# Patient Record
Sex: Male | Born: 1965 | Race: White | Hispanic: No | Marital: Married | Smoking: Never smoker
Health system: Southern US, Community
[De-identification: ages and names within clinical notes are randomized; demographics above are authoritative.]

## PROBLEM LIST (undated history)

## (undated) DIAGNOSIS — I10 Essential (primary) hypertension: Secondary | ICD-10-CM

---

## 2008-04-09 ENCOUNTER — Encounter: Payer: Self-pay | Admitting: Family Medicine

## 2008-04-10 ENCOUNTER — Encounter: Payer: Self-pay | Admitting: Family Medicine

## 2008-04-10 LAB — CONVERTED CEMR LAB
AST: 27 units/L
Albumin: 4.7 g/dL
Calcium: 10.1 mg/dL
Chloride: 98 meq/L
Cholesterol: 217 mg/dL
Creatinine, Ser: 1.5 mg/dL
HDL: 38 mg/dL
Potassium: 4.4 meq/L
Sodium: 136 meq/L
Total Protein: 7.6 g/dL
Triglycerides: 129 mg/dL

## 2008-11-11 ENCOUNTER — Ambulatory Visit: Payer: Self-pay | Admitting: Family Medicine

## 2008-11-11 DIAGNOSIS — M25569 Pain in unspecified knee: Secondary | ICD-10-CM

## 2008-11-11 DIAGNOSIS — I10 Essential (primary) hypertension: Secondary | ICD-10-CM

## 2008-11-11 DIAGNOSIS — E785 Hyperlipidemia, unspecified: Secondary | ICD-10-CM | POA: Insufficient documentation

## 2008-11-11 LAB — CONVERTED CEMR LAB
ALT: 32 units/L (ref 0–53)
Alkaline Phosphatase: 59 units/L (ref 39–117)
LDL Cholesterol: 157 mg/dL — ABNORMAL HIGH (ref 0–99)
Sodium: 140 meq/L (ref 135–145)
Total Bilirubin: 0.6 mg/dL (ref 0.3–1.2)
Total Protein: 7.9 g/dL (ref 6.0–8.3)
Triglycerides: 253 mg/dL — ABNORMAL HIGH (ref <150)
VLDL: 51 mg/dL — ABNORMAL HIGH (ref 0–40)

## 2008-11-12 ENCOUNTER — Encounter: Payer: Self-pay | Admitting: Family Medicine

## 2008-11-18 ENCOUNTER — Encounter: Payer: Self-pay | Admitting: Family Medicine

## 2008-11-19 ENCOUNTER — Encounter: Payer: Self-pay | Admitting: Family Medicine

## 2008-11-19 ENCOUNTER — Encounter: Admission: RE | Admit: 2008-11-19 | Discharge: 2008-11-19 | Payer: Self-pay | Admitting: Sports Medicine

## 2009-01-07 ENCOUNTER — Ambulatory Visit: Payer: Self-pay | Admitting: Family Medicine

## 2009-01-07 DIAGNOSIS — F528 Other sexual dysfunction not due to a substance or known physiological condition: Secondary | ICD-10-CM

## 2009-01-07 LAB — CONVERTED CEMR LAB: HDL goal, serum: 40 mg/dL

## 2009-01-08 ENCOUNTER — Telehealth (INDEPENDENT_AMBULATORY_CARE_PROVIDER_SITE_OTHER): Payer: Self-pay | Admitting: *Deleted

## 2009-01-08 ENCOUNTER — Encounter: Payer: Self-pay | Admitting: Family Medicine

## 2009-01-14 ENCOUNTER — Encounter: Payer: Self-pay | Admitting: Family Medicine

## 2009-02-23 ENCOUNTER — Telehealth: Payer: Self-pay | Admitting: Family Medicine

## 2009-03-10 ENCOUNTER — Encounter: Payer: Self-pay | Admitting: Family Medicine

## 2009-03-18 ENCOUNTER — Encounter (INDEPENDENT_AMBULATORY_CARE_PROVIDER_SITE_OTHER): Payer: Self-pay | Admitting: *Deleted

## 2010-01-20 IMAGING — CR DG KNEE 1-2V*L*
2 series · 2 of 2 positions shown · non-contrast
Comparison: None.

CLINICAL DATA: Knee pain.

LEFT KNEE - 1-2 VIEW

[view not recorded (1 of 2)]
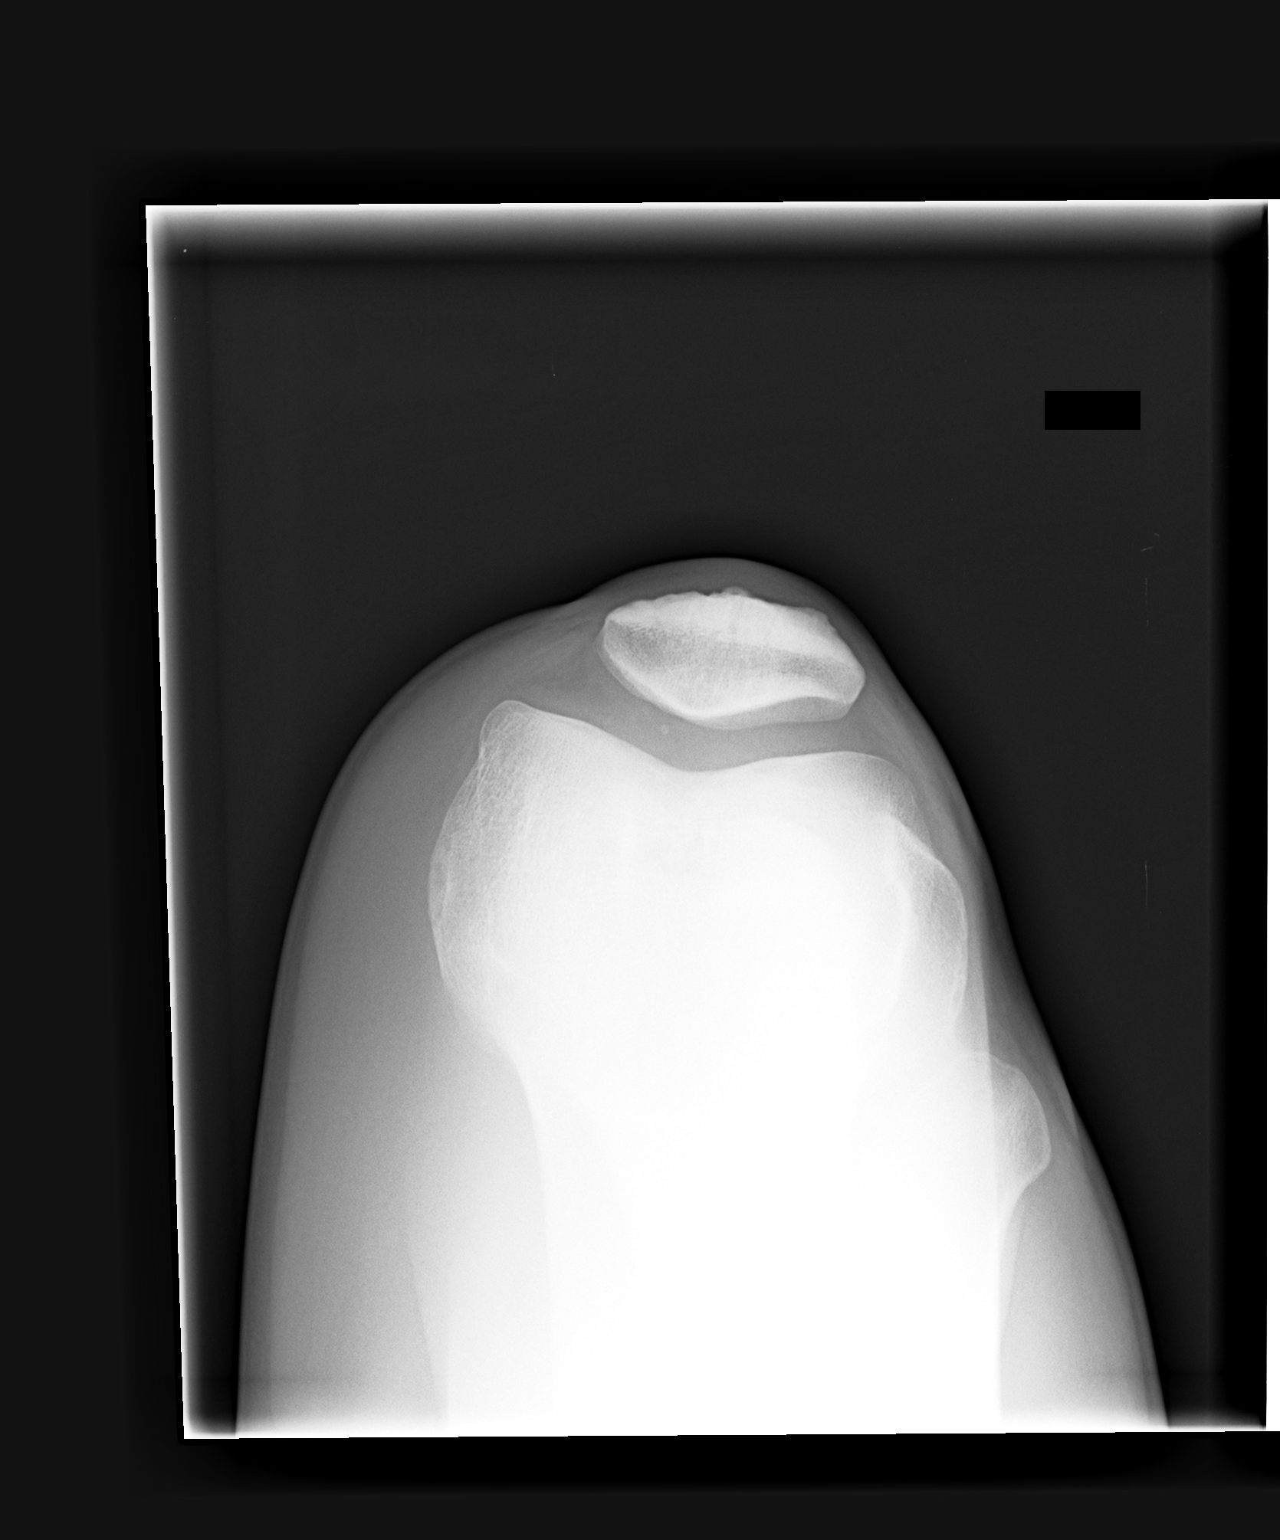

[view not recorded (2 of 2)]
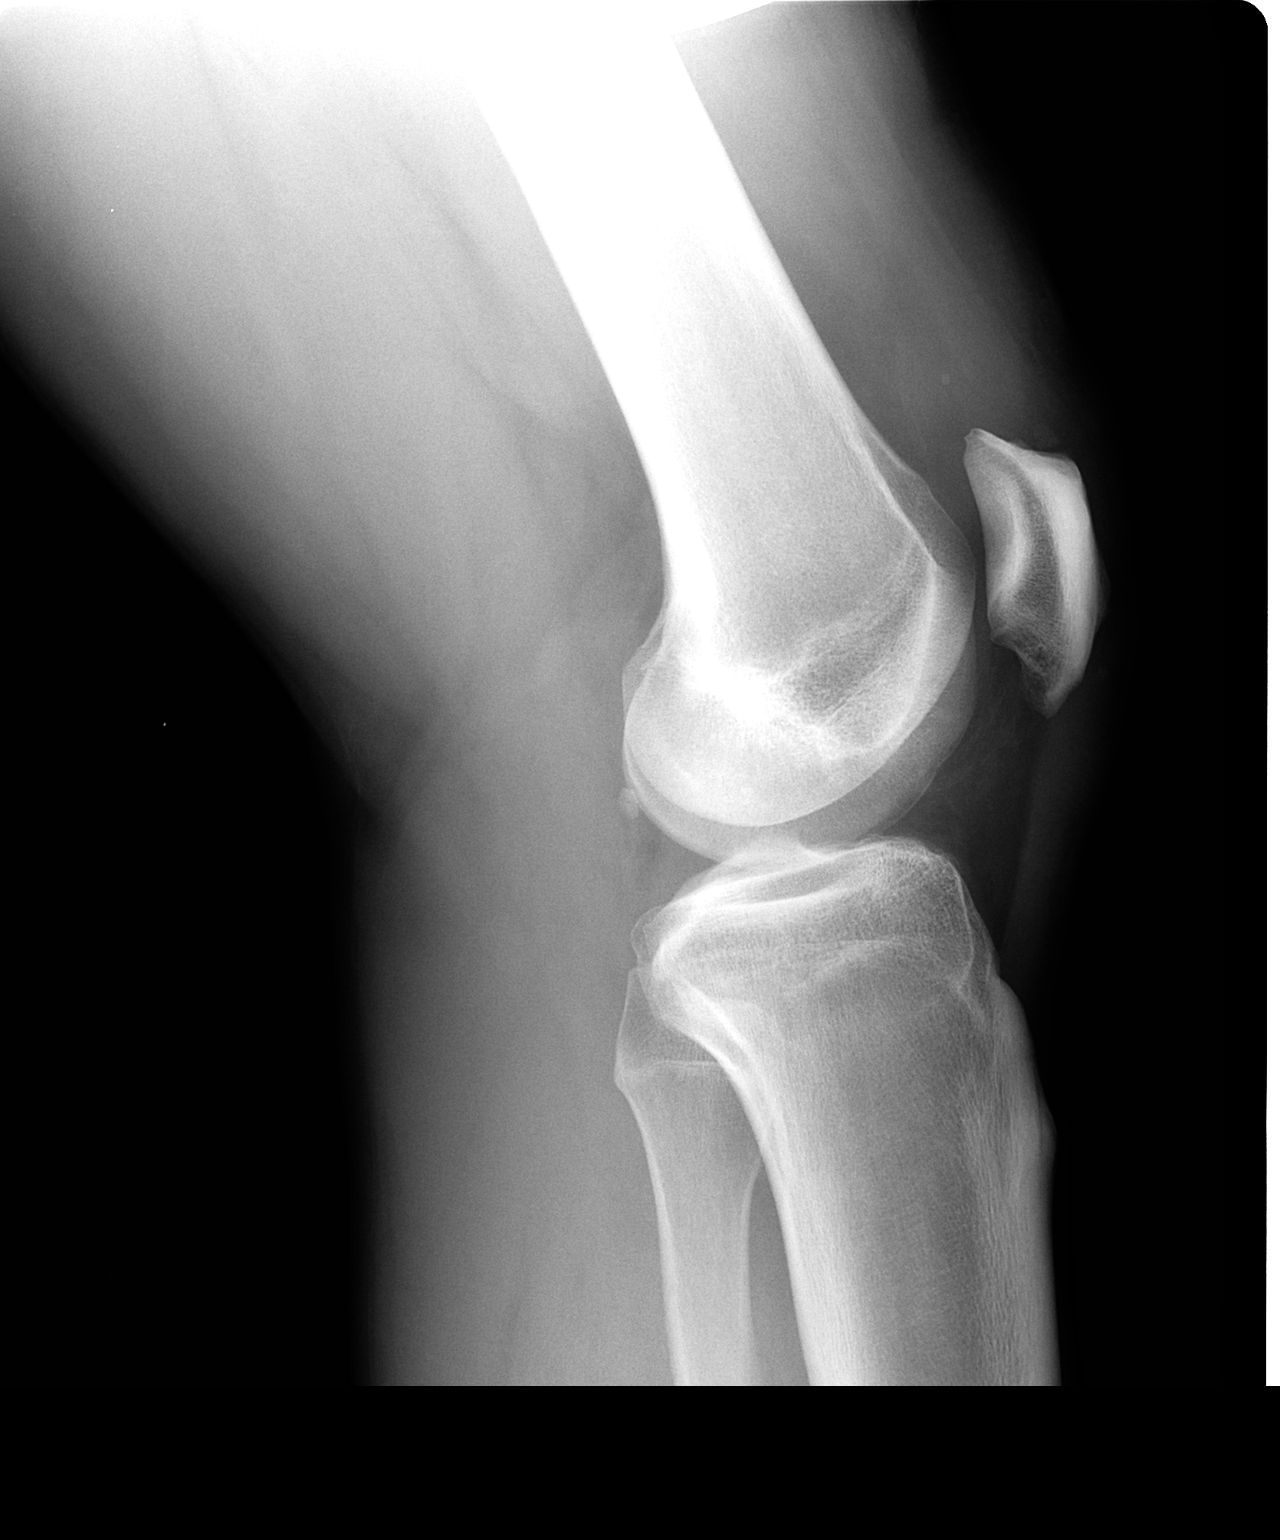

[2 of 2 positions shown; findings below may reference images not displayed]

FINDINGS: Lateral and sunrise views of the left knee show no acute
bony abnormality.  There is no evidence for joint effusion.  There
is trace spurring in the patellofemoral compartment.
IMPRESSION: No acute bony findings.

## 2010-01-20 IMAGING — CR DG KNEE 1-2V*R*
2 series · 2 of 2 positions shown · non-contrast
Comparison: None.

CLINICAL DATA: Knee pain

RIGHT KNEE - 1-2 VIEW

[view not recorded (1 of 2)]
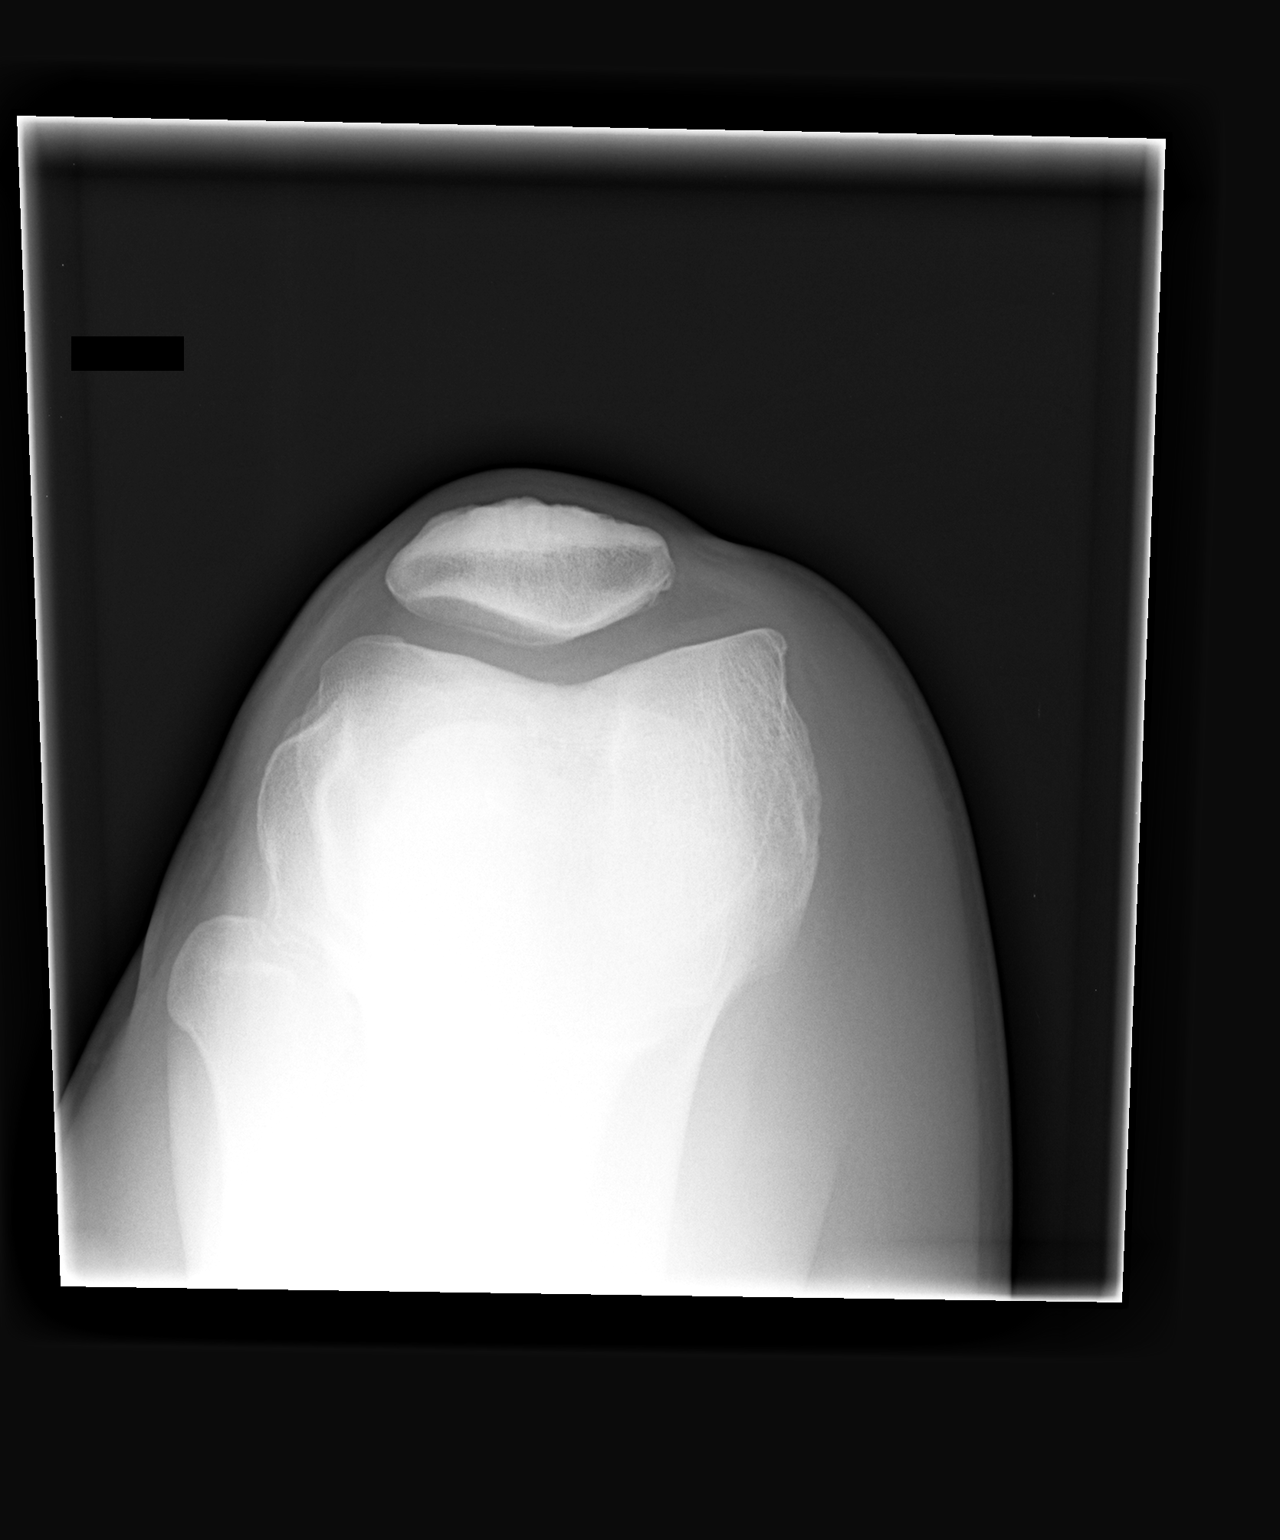

[view not recorded (2 of 2)]
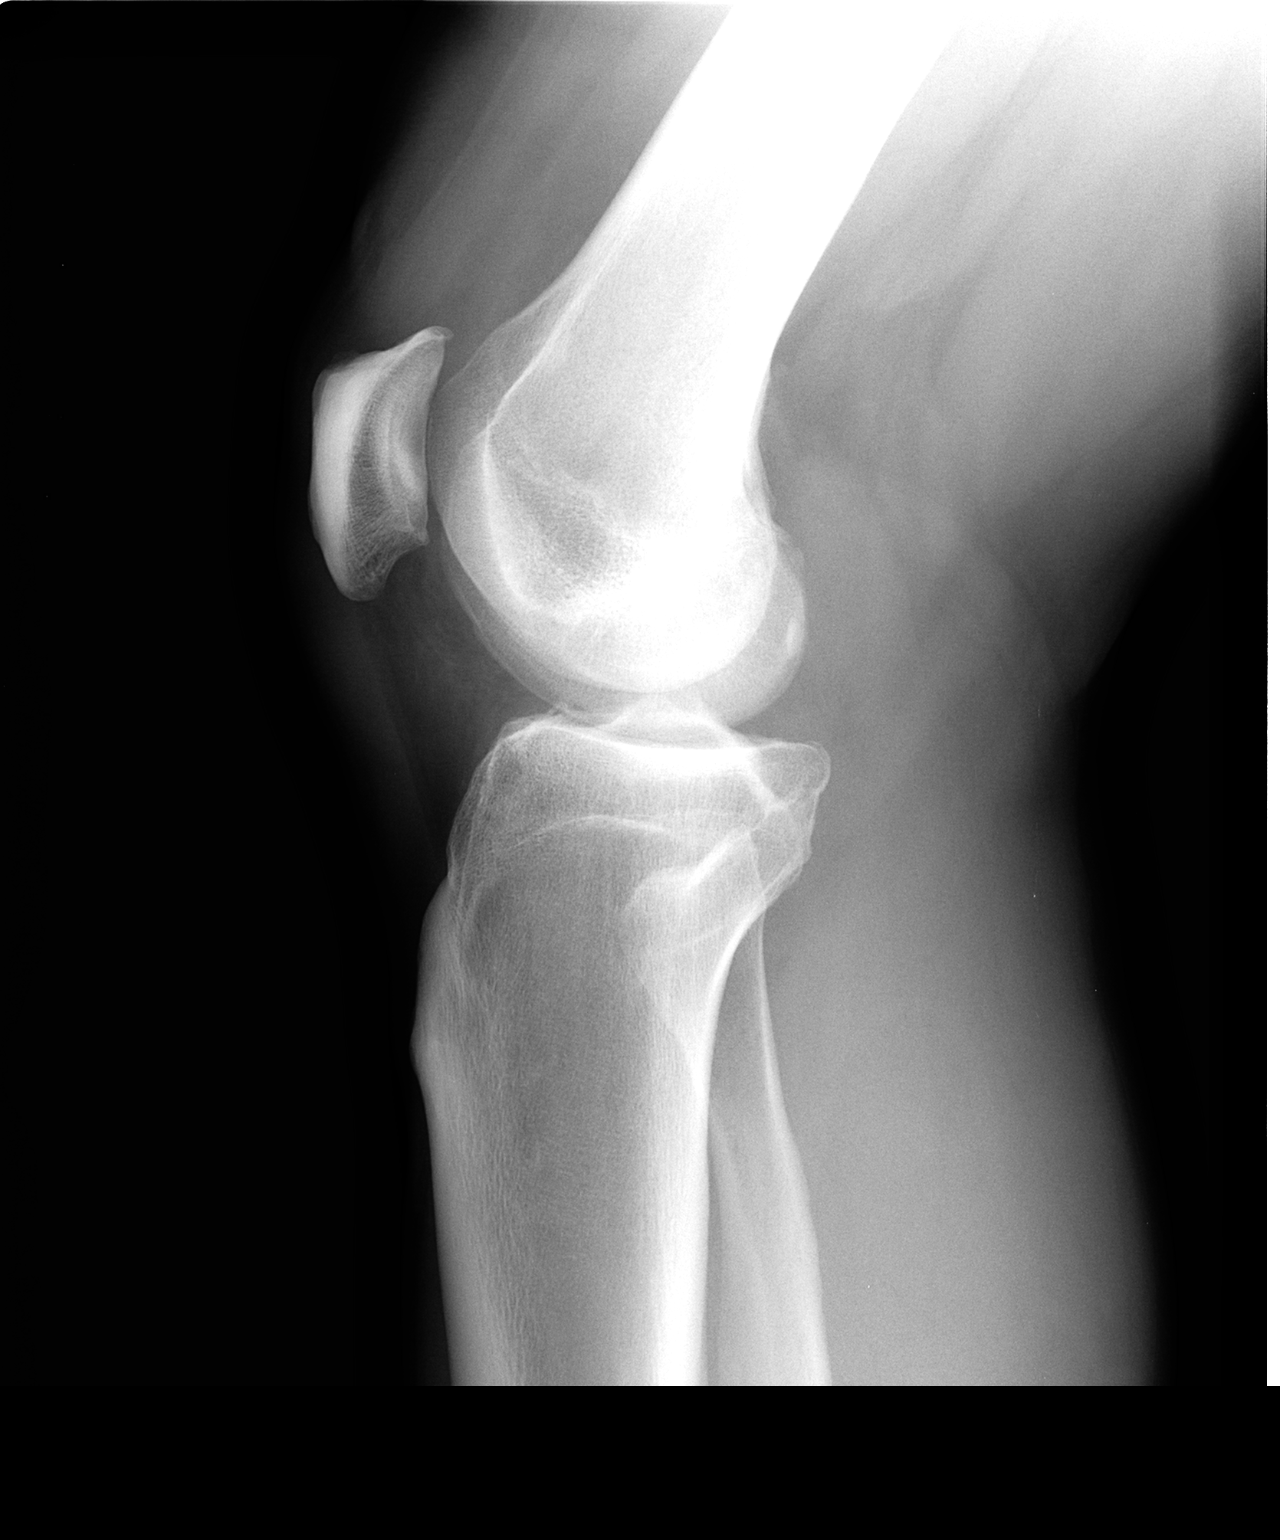

[2 of 2 positions shown; findings below may reference images not displayed]

FINDINGS: Sunrise and lateral views of the right knee show no
evidence for joint effusion.  Hypertrophic spurring in the
patellofemoral compartment is slightly more advanced than in the
contralateral knee.  No worrisome lytic or sclerotic osseous
lesion.
IMPRESSION: Mild degenerative changes in the patellofemoral compartment.

## 2012-07-22 ENCOUNTER — Encounter (HOSPITAL_COMMUNITY): Payer: Self-pay | Admitting: *Deleted

## 2012-07-22 ENCOUNTER — Emergency Department (HOSPITAL_COMMUNITY)
Admission: EM | Admit: 2012-07-22 | Discharge: 2012-07-22 | Disposition: A | Discharge status: Home / Self Care | Payer: BC Managed Care – PPO | Source: Home / Self Care | Attending: Family Medicine | Admitting: Family Medicine

## 2012-07-22 DIAGNOSIS — J329 Chronic sinusitis, unspecified: Secondary | ICD-10-CM

## 2012-07-22 MED ORDER — SALINE NASAL SPRAY 0.65 % NA SOLN: 1.0000 | NASAL | Status: DC | PRN

## 2012-07-22 MED ORDER — IBUPROFEN 800 MG PO TABS: 800.0000 mg | ORAL_TABLET | Freq: Three times a day (TID) | ORAL | Status: AC

## 2012-07-22 MED ORDER — AZITHROMYCIN 250 MG PO TABS: ORAL_TABLET | ORAL | Status: AC

## 2012-07-22 NOTE — ED Provider Notes (Signed)
History     CSN: [id number]  Arrival date & time 07/22/12  1317   None     Chief Complaint  Patient presents with  . Nasal Congestion    (Consider location/radiation/quality/duration/timing/severity/associated sxs/prior treatment) The history is provided by the patient.  Ricky Barrett is a 46 y.o. male who complains of onset of sinus symptoms for 5 days.  + sore throat + cough, non productive No pleuritic pain No wheezing + nasal congestion No post-nasal drainage + sinus pain/pressure No voice changes No chest congestion No itchy/red eyes + earache No hemoptysis No SOB + chills/sweats No fever No nausea No vomiting No abdominal pain No diarrhea No skin rashes No fatigue + myalgias + headache  No ill contacts   Pt also requests refill of ibuprofen related chronic back pain.   History reviewed. No pertinent past medical history.  History reviewed. No pertinent past surgical history.  No family history on file.  History  Substance Use Topics  . Smoking status: Not on file  . Smokeless tobacco: Not on file  . Alcohol Use: Not on file      Review of Systems  All other systems reviewed and are negative.    Allergies  Review of patient's allergies indicates not on file.  Home Medications   Current Outpatient Rx  Name Route Sig Dispense Refill  . LISINOPRIL 10 MG PO TABS Oral Take 10 mg by mouth daily.    . AZITHROMYCIN 250 MG PO TABS  Azithromycin 500mg  on day 1, then 250mg  on days 2-4 6 tablet 0  . IBUPROFEN 800 MG PO TABS Oral Take 1 tablet (800 mg total) by mouth 3 (three) times daily. 30 tablet 3  . SALINE NASAL SPRAY 0.65 % NA SOLN Nasal Place 1 spray into the nose as needed for congestion. 30 mL 12    BP 145/96  Pulse 73  Temp 99.1 F (37.3 C) (Oral)  Resp 18  SpO2 96%  Physical Exam  Nursing note and vitals reviewed. Constitutional: He is oriented to person, place, and time. Vital signs are normal. He appears well-developed and  well-nourished. He is active and cooperative.  HENT:  Head: Normocephalic.  Right Ear: External ear normal.  Left Ear: External ear normal.  Nose: Right sinus exhibits maxillary sinus tenderness and frontal sinus tenderness. Left sinus exhibits maxillary sinus tenderness and frontal sinus tenderness.  Mouth/Throat: Oropharynx is clear and moist. No oropharyngeal exudate.  Eyes: Conjunctivae and EOM are normal. Pupils are equal, round, and reactive to light. No scleral icterus.  Neck: Trachea normal and normal range of motion. Neck supple. No thyromegaly present.  Cardiovascular: Normal rate, regular rhythm and normal heart sounds.   Pulmonary/Chest: Effort normal and breath sounds normal.  Lymphadenopathy:    He has no cervical adenopathy.  Neurological: He is alert and oriented to person, place, and time. No cranial nerve deficit or sensory deficit.  Skin: Skin is warm and dry.  Psychiatric: He has a normal mood and affect. His speech is normal and behavior is normal. Judgment and thought content normal. Cognition and memory are normal.    ED Course  Procedures (including critical care time)  Labs Reviewed - No data to display No results found.   1. Sinusitis       MDM          Johnsie Kindred, NP 07/22/12 1438

## 2012-07-22 NOTE — ED Notes (Signed)
1458 Pt. asked for a for a work note after he was discharged.  Ricky Barrett did the note to return to work tomorrow and I gave it to the pt. [name] 07/22/2012

## 2012-07-22 NOTE — ED Notes (Signed)
Pt  Has  Symptoms  Of  Nasal  Congestion  Headache         And  Dinus  Drainage    X   3  Days  He  Also reports  Back pain  Described  As  Aching    And  r  Knee    Pain  He  denys  Any specefic  Injury      He  Is  Sitting  Upright on  Exam table  In no  Severe  Distress

## 2012-07-27 NOTE — ED Provider Notes (Signed)
Medical screening examination/treatment/procedure(s) were performed by resident physician or non-physician practitioner and as supervising physician I was immediately available for consultation/collaboration.   KINDL,JAMES DOUGLAS MD.    James D Kindl, MD 07/27/12 1040 

## 2013-03-02 ENCOUNTER — Encounter (HOSPITAL_COMMUNITY): Payer: Self-pay | Admitting: Emergency Medicine

## 2013-03-02 ENCOUNTER — Emergency Department (HOSPITAL_COMMUNITY): Payer: BC Managed Care – PPO

## 2013-03-02 DIAGNOSIS — Z79899 Other long term (current) drug therapy: Secondary | ICD-10-CM | POA: Insufficient documentation

## 2013-03-02 DIAGNOSIS — R51 Headache: Secondary | ICD-10-CM | POA: Insufficient documentation

## 2013-03-02 DIAGNOSIS — R55 Syncope and collapse: Secondary | ICD-10-CM | POA: Insufficient documentation

## 2013-03-02 DIAGNOSIS — R05 Cough: Secondary | ICD-10-CM | POA: Insufficient documentation

## 2013-03-02 DIAGNOSIS — I10 Essential (primary) hypertension: Secondary | ICD-10-CM | POA: Insufficient documentation

## 2013-03-02 DIAGNOSIS — R509 Fever, unspecified: Secondary | ICD-10-CM | POA: Insufficient documentation

## 2013-03-02 DIAGNOSIS — IMO0001 Reserved for inherently not codable concepts without codable children: Secondary | ICD-10-CM | POA: Insufficient documentation

## 2013-03-02 DIAGNOSIS — R059 Cough, unspecified: Secondary | ICD-10-CM | POA: Insufficient documentation

## 2013-03-02 LAB — CBC WITH DIFFERENTIAL/PLATELET
Basophils Absolute: 0 10*3/uL (ref 0.0–0.1)
Basophils Relative: 0 % (ref 0–1)
Eosinophils Absolute: 0.1 10*3/uL (ref 0.0–0.7)
Eosinophils Relative: 1 % (ref 0–5)
HCT: 40.6 % (ref 39.0–52.0)
MCH: 30.2 pg (ref 26.0–34.0)
MCHC: 35.5 g/dL (ref 30.0–36.0)
MCV: 85.1 fL (ref 78.0–100.0)
Monocytes Absolute: 0.4 10*3/uL (ref 0.1–1.0)
Monocytes Relative: 5 % (ref 3–12)
Neutro Abs: 4.8 10*3/uL (ref 1.7–7.7)
RDW: 15 % (ref 11.5–15.5)

## 2013-03-02 MED ORDER — ACETAMINOPHEN 325 MG PO TABS
650.0000 mg | ORAL_TABLET | Freq: Once | ORAL | Status: AC
  Administered 2013-03-02: 650 mg via ORAL
  Filled 2013-03-02: qty 2

## 2013-03-02 NOTE — ED Notes (Signed)
PT. REPORTS FEVER WITH CHILLS, DRY COUGH AND GENERALIZED BODY ACHES ONSET LAST Friday.

## 2013-03-03 ENCOUNTER — Emergency Department (HOSPITAL_COMMUNITY)
Admission: EM | Admit: 2013-03-03 | Discharge: 2013-03-03 | Disposition: A | Discharge status: Home / Self Care | Payer: BC Managed Care – PPO | Attending: Emergency Medicine | Admitting: Emergency Medicine

## 2013-03-03 DIAGNOSIS — R05 Cough: Secondary | ICD-10-CM

## 2013-03-03 HISTORY — DX: Essential (primary) hypertension: I10

## 2013-03-03 LAB — URINALYSIS, ROUTINE W REFLEX MICROSCOPIC
Glucose, UA: NEGATIVE mg/dL
Hgb urine dipstick: NEGATIVE
Leukocytes, UA: NEGATIVE
Protein, ur: NEGATIVE mg/dL
Specific Gravity, Urine: 1.03 (ref 1.005–1.030)
Urobilinogen, UA: 0.2 mg/dL (ref 0.0–1.0)

## 2013-03-03 LAB — COMPREHENSIVE METABOLIC PANEL
AST: 34 U/L (ref 0–37)
Albumin: 3.6 g/dL (ref 3.5–5.2)
BUN: 18 mg/dL (ref 6–23)
Calcium: 9.4 mg/dL (ref 8.4–10.5)
Creatinine, Ser: 1.43 mg/dL — ABNORMAL HIGH (ref 0.50–1.35)
Total Protein: 7.3 g/dL (ref 6.0–8.3)

## 2013-03-03 MED ORDER — HYDROCOD POLST-CHLORPHEN POLST 10-8 MG/5ML PO LQCR: 5.0000 mL | Freq: Two times a day (BID) | ORAL | Status: DC

## 2013-03-03 MED ORDER — SODIUM CHLORIDE 0.9 % IV BOLUS (SEPSIS)
1000.0000 mL | Freq: Once | INTRAVENOUS | Status: AC
  Administered 2013-03-03: 1000 mL via INTRAVENOUS

## 2013-03-03 MED ORDER — HYDROCOD POLST-CHLORPHEN POLST 10-8 MG/5ML PO LQCR
5.0000 mL | Freq: Once | ORAL | Status: AC
  Administered 2013-03-03: 5 mL via ORAL
  Filled 2013-03-03: qty 5

## 2013-03-03 NOTE — ED Notes (Signed)
Called to room by family, family describes possible vagal episode while coughing, stated, "after coughing, his eyes rolled back and he was not really with it". Pt now alert, NAD, calm, interactive, resps e/u, speaking in clear sentences, appropriate, denies pain or nausea, skin clammy, VSS, placed on cardiac monitor, (recent fever w/ tylenol given), hx, labs and xray reviewed, IV established, family at [facility] x2, [facility], EDP made aware, orders received and initiated.

## 2013-03-03 NOTE — ED Provider Notes (Signed)
History     CSN: [id number]  Arrival date & time 03/02/13  2321   First MD Initiated Contact with Patient 03/03/13 [phone]      Chief Complaint  Patient presents with  . Fever  . Cough  . Generalized Body Aches    (Consider location/radiation/quality/duration/timing/severity/associated sxs/prior treatment) HPI Comments: Ricky Barrett is a 47 year old gentleman, with a history of hypertension.  He reports 4, days of myalgias, fever to 102 at home, dry, hacking cough, and myalgias.  He, states he coughs to the point where he "passes out."  He is been taking over-the-counter Alka-Seltzer without relief.  His family, states he's had 3 episodes of syncope, or near-syncope, with coughing in the last 24 hours, they describe it as coughing to the point where his eyes rolled back in his head.  Patient states coughing is worse when he lays down.  He does have sinus pressure.  In the frontal sinuses and a mild headache  Patient is a 47 y.o. male presenting with fever and cough. The history is provided by the patient.  Fever Max temp prior to arrival:  102 Temp source:  Oral Severity:  Moderate Timing:  Intermittent Chronicity:  New Relieved by:  Acetaminophen Worsened by:  Nothing tried Associated symptoms: chills, cough and myalgias   Associated symptoms: no chest pain, no diarrhea, no dysuria, no ear pain, no headaches, no nausea, no rhinorrhea, no sore throat and no vomiting   Cough:    Cough characteristics:  Non-productive and hacking   Onset quality:  Gradual Myalgias:    Location:  Generalized   Severity:  Moderate   Onset quality:  Gradual   Duration:  3 days   Timing:  Constant Cough Associated symptoms: chills, fever and myalgias   Associated symptoms: no chest pain, no ear pain, no headaches, no rhinorrhea, no shortness of breath, no sore throat and no wheezing     Past Medical History  Diagnosis Date  . Hypertension     History reviewed. No pertinent past surgical  history.  No family history on file.  History  Substance Use Topics  . Smoking status: Never Smoker   . Smokeless tobacco: Not on file  . Alcohol Use: No      Review of Systems  Constitutional: Positive for fever and chills.  HENT: Positive for sinus pressure. Negative for ear pain, sore throat, rhinorrhea, sneezing, neck pain and neck stiffness.   Respiratory: Positive for cough. Negative for shortness of breath and wheezing.   Cardiovascular: Negative for chest pain.  Gastrointestinal: Negative for nausea, vomiting and diarrhea.  Genitourinary: Negative for dysuria.  Musculoskeletal: Positive for myalgias.  Neurological: Negative for weakness and headaches.  All other systems reviewed and are negative.    Allergies  Review of patient's allergies indicates no known allergies.  Home Medications   Current Outpatient Rx  Name  Route  Sig  Dispense  Refill  . acetaminophen (TYLENOL) 500 MG tablet   Oral   Take 1,000 mg by mouth every 6 (six) hours as needed for pain.         Marland Kitchen lisinopril (PRINIVIL,ZESTRIL) 10 MG tablet   Oral   Take 10 mg by mouth daily.         . sertraline (ZOLOFT) 100 MG tablet   Oral   Take 100 mg by mouth daily.         . chlorpheniramine-HYDROcodone (TUSSIONEX) 10-8 MG/5ML LQCR   Oral   Take 5 mLs by mouth every 12 (  twelve) hours.   140 mL   0     BP 121/77  Pulse 62  Temp(Src) 99 F (37.2 C) (Oral)  Resp 20  SpO2 97%  Physical Exam  Nursing note and vitals reviewed. Constitutional: He is oriented to person, place, and time. He appears well-developed and well-nourished. No distress.  HENT:  Head: Atraumatic.  Eyes: Pupils are equal, round, and reactive to light.  Neck: Normal range of motion.  Cardiovascular: Normal rate and regular rhythm.   Pulmonary/Chest: Effort normal. No respiratory distress. He exhibits no tenderness.  Abdominal: Soft. Bowel sounds are normal. He exhibits no distension.  Musculoskeletal: Normal  range of motion.  Neurological: He is alert and oriented to person, place, and time.  Skin: Skin is warm and dry.    ED Course  Procedures (including critical care time)  Labs Reviewed  COMPREHENSIVE METABOLIC PANEL - Abnormal; Notable for the following:    Glucose, Bld 118 (*)    Creatinine, Ser 1.43 (*)    GFR calc non Af Amer 58 (*)    GFR calc Af Amer 67 (*)    All other components within normal limits  URINALYSIS, ROUTINE W REFLEX MICROSCOPIC - Abnormal; Notable for the following:    APPearance CLOUDY (*)    All other components within normal limits  CBC WITH DIFFERENTIAL   Dg Chest 2 View  03/03/2013  *RADIOLOGY REPORT*  Clinical Data: Fever and cough.  Bodyaches.  CHEST - 2 VIEW  Comparison: None.  Findings: Midline trachea.  Normal heart size and mediastinal contours. No pleural effusion or pneumothorax.  Clear lungs.  IMPRESSION: No acute cardiopulmonary disease.   Original Report Authenticated By: Jeronimo Greaves, M.D.      1. Cough syncope      Date: 03/03/2013  Rate: 76  Rhythm: normal sinus rhythm  QRS Axis: normal  Intervals: normal  ST/T Wave abnormalities: nonspecific T wave changes  Conduction Disutrbances:none  Narrative Interpretation:   Old EKG Reviewed: unchanged   MDM  Reviewed patient's labs, EKG, chest x-ray, all noncontributory.  Most likely viral illness, with cough, syncope, we'll prescribe Tussionex, and hydration Patient has been hydrated, and given a dose of Tussionex, stating he, feels much, better.  EKG, is reviewed and reviewed, as well as labs, and urine, without any indication for infection or need for, antibiotics.  We'll discharge home with a prescription for Tussionex, and encourage patient to stay hydrated, and continue his decongestant in the form of over-the-counter Alka-Seltzer       Arman Filter, NP 03/03/13 4098

## 2013-03-03 NOTE — ED Notes (Signed)
PT reports feeling "much better" since receiving Tylenol.

## 2013-03-03 NOTE — ED Notes (Signed)
Old and new

## 2013-03-04 NOTE — ED Provider Notes (Signed)
Medical screening examination/treatment/procedure(s) were performed by non-physician practitioner and as supervising physician I was immediately available for consultation/collaboration.   Brandt Loosen, MD 03/04/13 [phone]

## 2013-08-08 ENCOUNTER — Encounter: Payer: Self-pay | Admitting: Physician Assistant

## 2013-08-08 ENCOUNTER — Ambulatory Visit (INDEPENDENT_AMBULATORY_CARE_PROVIDER_SITE_OTHER): Payer: BC Managed Care – PPO | Admitting: Physician Assistant

## 2013-08-08 VITALS — BP 114/67 | HR 63 | Temp 98.3°F | Ht 72.0 in | Wt 252.0 lb

## 2013-08-08 DIAGNOSIS — K219 Gastro-esophageal reflux disease without esophagitis: Secondary | ICD-10-CM

## 2013-08-08 DIAGNOSIS — I10 Essential (primary) hypertension: Secondary | ICD-10-CM

## 2013-08-08 DIAGNOSIS — J069 Acute upper respiratory infection, unspecified: Secondary | ICD-10-CM

## 2013-08-08 MED ORDER — HYDROCODONE-HOMATROPINE 5-1.5 MG/5ML PO SYRP: 5.0000 mL | ORAL_SOLUTION | Freq: Every evening | ORAL | Status: DC | PRN

## 2013-08-08 MED ORDER — AMOXICILLIN-POT CLAVULANATE 875-125 MG PO TABS: 1.0000 | ORAL_TABLET | Freq: Two times a day (BID) | ORAL | Status: DC

## 2013-08-08 MED ORDER — FLUTICASONE PROPIONATE 50 MCG/ACT NA SUSP: 2.0000 | Freq: Every day | NASAL | Status: DC

## 2013-08-08 NOTE — Patient Instructions (Addendum)
Upper Respiratory Infection, Adult An upper respiratory infection (URI) is also known as the common cold. It is often caused by a type of germ (virus). Colds are easily spread (contagious). You can pass it to others by kissing, coughing, sneezing, or drinking out of the same glass. Usually, you get better in 1 or 2 weeks.  HOME CARE   Only take medicine as told by your doctor.  Use a warm mist humidifier or breathe in steam from a hot shower.  Drink enough water and fluids to keep your pee (urine) clear or pale yellow.  Get plenty of rest.  Return to work when your temperature is back to normal or as told by your doctor. You may use a face mask and wash your hands to stop your cold from spreading. GET HELP RIGHT AWAY IF:   After the first few days, you feel you are getting worse.  You have questions about your medicine.  You have chills, shortness of breath, or brown or red spit (mucus).  You have yellow or brown snot (nasal discharge) or pain in the face, especially when you bend forward.  You have a fever, puffy (swollen) neck, pain when you swallow, or white spots in the back of your throat.  You have a bad headache, ear pain, sinus pain, or chest pain.  You have a high-pitched whistling sound when you breathe in and out (wheezing).  You have a lasting cough or cough up blood.  You have sore muscles or a stiff neck. MAKE SURE YOU:   Understand these instructions.  Will watch your condition.  Will get help right away if you are not doing well or get worse. Document Released: 04/17/2008 Document Revised: 01/22/2012 Document Reviewed: 03/06/2011 [facility] Patient Information 2014 [place], [place].   Sinusitis Sinusitis is redness, soreness, and swelling (inflammation) of the paranasal sinuses. Paranasal sinuses are air pockets within the bones of your face (beneath the eyes, the middle of the forehead, or above the eyes). In healthy paranasal sinuses, mucus is able to drain  out, and air is able to circulate through them by way of your nose. However, when your paranasal sinuses are inflamed, mucus and air can become trapped. This can allow bacteria and other germs to grow and cause infection. Sinusitis can develop quickly and last only a short time (acute) or continue over a long period (chronic). Sinusitis that lasts for more than 12 weeks is considered chronic.  CAUSES  Causes of sinusitis include:  Allergies.  Structural abnormalities, such as displacement of the cartilage that separates your nostrils (deviated septum), which can decrease the air flow through your nose and sinuses and affect sinus drainage.  Functional abnormalities, such as when the small hairs (cilia) that line your sinuses and help remove mucus do not work properly or are not present. SYMPTOMS  Symptoms of acute and chronic sinusitis are the same. The primary symptoms are pain and pressure around the affected sinuses. Other symptoms include:  Upper toothache.  Earache.  Headache.  Bad breath.  Decreased sense of smell and taste.  A cough, which worsens when you are lying flat.  Fatigue.  Fever.  Thick drainage from your nose, which often is green and may contain pus (purulent).  Swelling and warmth over the affected sinuses. DIAGNOSIS  Your caregiver will perform a physical exam. During the exam, your caregiver may:  Look in your nose for signs of abnormal growths in your nostrils (nasal polyps).  Tap over the affected sinus to  check for signs of infection.  View the inside of your sinuses (endoscopy) with a special imaging device with a light attached (endoscope), which is inserted into your sinuses. If your caregiver suspects that you have chronic sinusitis, one or more of the following tests may be recommended:  Allergy tests.  Nasal culture A sample of mucus is taken from your nose and sent to a lab and screened for bacteria.  Nasal cytology A sample of mucus is  taken from your nose and examined by your caregiver to determine if your sinusitis is related to an allergy. TREATMENT  Most cases of acute sinusitis are related to a viral infection and will resolve on their own within 10 days. Sometimes medicines are prescribed to help relieve symptoms (pain medicine, decongestants, nasal steroid sprays, or saline sprays).  However, for sinusitis related to a bacterial infection, your caregiver will prescribe antibiotic medicines. These are medicines that will help kill the bacteria causing the infection.  Rarely, sinusitis is caused by a fungal infection. In theses cases, your caregiver will prescribe antifungal medicine. For some cases of chronic sinusitis, surgery is needed. Generally, these are cases in which sinusitis recurs more than 3 times per year, despite other treatments. HOME CARE INSTRUCTIONS   Drink plenty of water. Water helps thin the mucus so your sinuses can drain more easily.  Use a humidifier.  Inhale steam 3 to 4 times a day (for example, sit in the bathroom with the shower running).  Apply a warm, moist washcloth to your face 3 to 4 times a day, or as directed by your caregiver.  Use saline nasal sprays to help moisten and clean your sinuses.  Take over-the-counter or prescription medicines for pain, discomfort, or fever only as directed by your caregiver. SEEK IMMEDIATE MEDICAL CARE IF:  You have increasing pain or severe headaches.  You have nausea, vomiting, or drowsiness.  You have swelling around your face.  You have vision problems.  You have a stiff neck.  You have difficulty breathing. MAKE SURE YOU:   Understand these instructions.  Will watch your condition.  Will get help right away if you are not doing well or get worse. Document Released: 10/30/2005 Document Revised: 01/22/2012 Document Reviewed: 11/14/2011 [facility] Patient Information 2014 [place], [place].

## 2013-08-08 NOTE — Progress Notes (Signed)
  Subjective:    Patient ID: Ricky Barrett, male    DOB: October 16, 1966, 47 y.o.   MRN: [id number]  HPI Patient is a 47 year old male who presents to the clinic to establish care and to discuss ongoing sinus pressure. Past medical history is positive for acid reflux and hypertension. Patient is on medications for both things considered well controlled. Patient declines ever smoking and does not drink alcohol regularly. Patient is active and works out on a regular basis at least 4 times a week. Patient is not aware of when his last labs were done.  Patient has been having sinus pressure and nasal congestion for the last 3 days. He denies any fever, chills, nausea, vomiting or diarrhea. He does so his sinuses training to the back of his throat and has created a sore throat with a cough. Her cough is worse at night and in the morning. Both of his ears are ringing. He denies any wheezing or shortness of breath.    Review of Systems  All other systems reviewed and are negative.       Objective:   Physical Exam  Constitutional: He is oriented to person, place, and time. He appears well-developed and well-nourished.  HENT:  Head: Normocephalic and atraumatic.  TMs are dull bilaterally however ossicles are viewed. Were fair nose is erythematous with post nasal drip present. Tonsils are normal size without exudate. Bilateral sinuses are nontender to palpation maxillary or frontal he. Nasal turbinates are red and swollen.  Eyes: Conjunctivae are normal. Right eye exhibits no discharge. Left eye exhibits no discharge.  Neck: Normal range of motion. Neck supple.  Cardiovascular: Normal rate, regular rhythm and normal heart sounds.   Pulmonary/Chest: Effort normal and breath sounds normal. He has no wheezes. He exhibits no tenderness.  Lymphadenopathy:    He has no cervical adenopathy.  Neurological: He is alert and oriented to person, place, and time.  Skin: Skin is warm and dry.  Psychiatric: He has a  normal mood and affect. His behavior is normal.          Assessment & Plan:  URI-patient was reassured that patient that I think this is viral in origin. Patient has not been to work in 2 days and note for work was given. I encouraged lots of rest and symptomatic care. I did give some cough syrup to use at bedtime for cough. I also gave her sample of neil med sinus rinse. I also prescribed a nasal spray to use daily. Patient was pronounced a prescription for an antibiotic. If not improving in the next 48 hours he may start antibiotic.  Hypertension-well-controlled on current therapy.  GERD-well-controlled on current therapy.

## 2013-08-11 ENCOUNTER — Telehealth: Payer: Self-pay | Admitting: *Deleted

## 2013-08-11 ENCOUNTER — Encounter: Payer: Self-pay | Admitting: *Deleted

## 2013-08-11 NOTE — Telephone Encounter (Signed)
Letter printed.

## 2013-08-11 NOTE — Telephone Encounter (Signed)
Pt left message stating that he was unable to go to work today & would like a work note.

## 2013-08-11 NOTE — Telephone Encounter (Signed)
Ok for work note. Did he start augmentin?

## 2014-05-03 IMAGING — CR DG CHEST 2V
2 series · 2 of 2 positions shown · non-contrast
Comparison: None.

CLINICAL DATA: Fever and cough.  Bodyaches.

CHEST - 2 VIEW

[w chest pa]
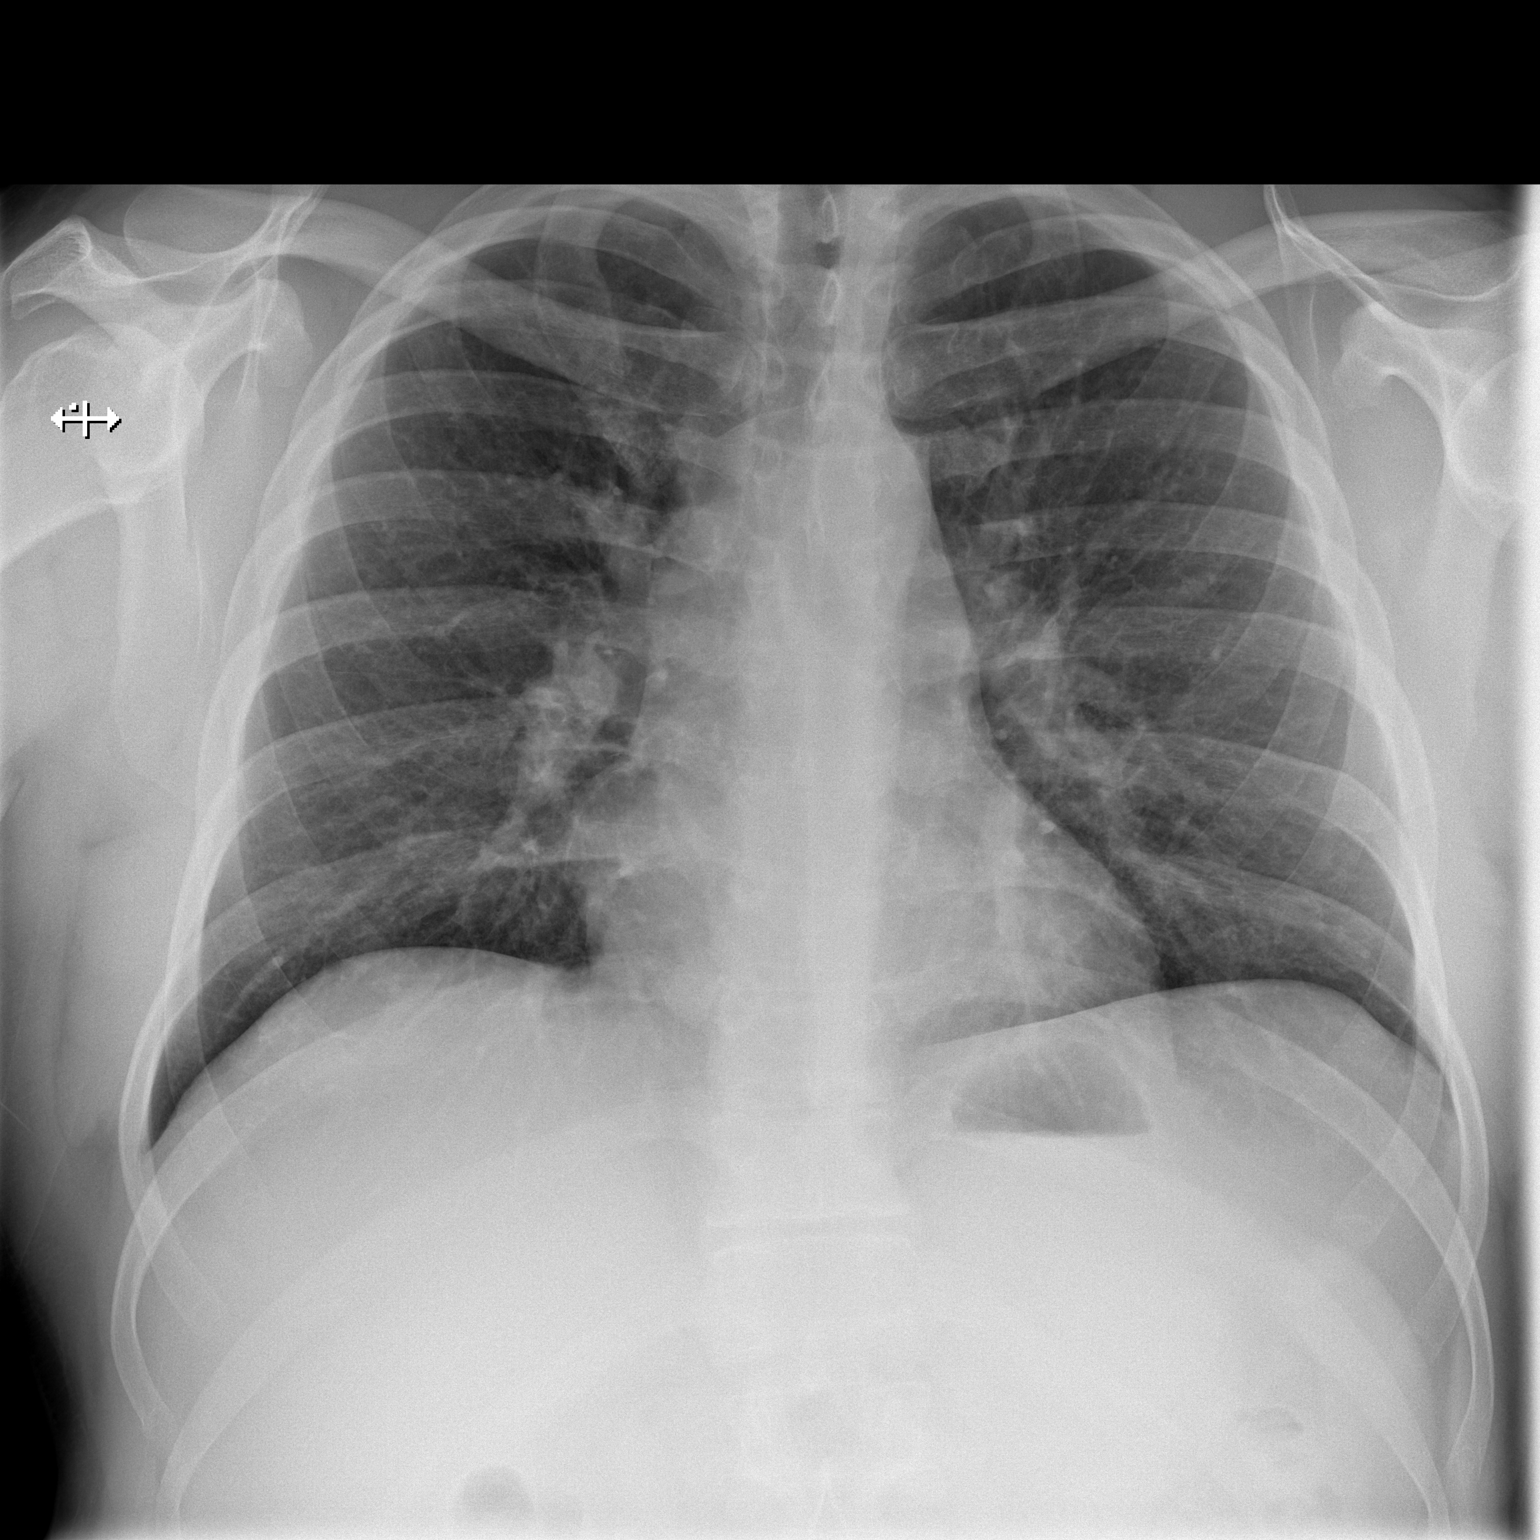

[w chest lat]
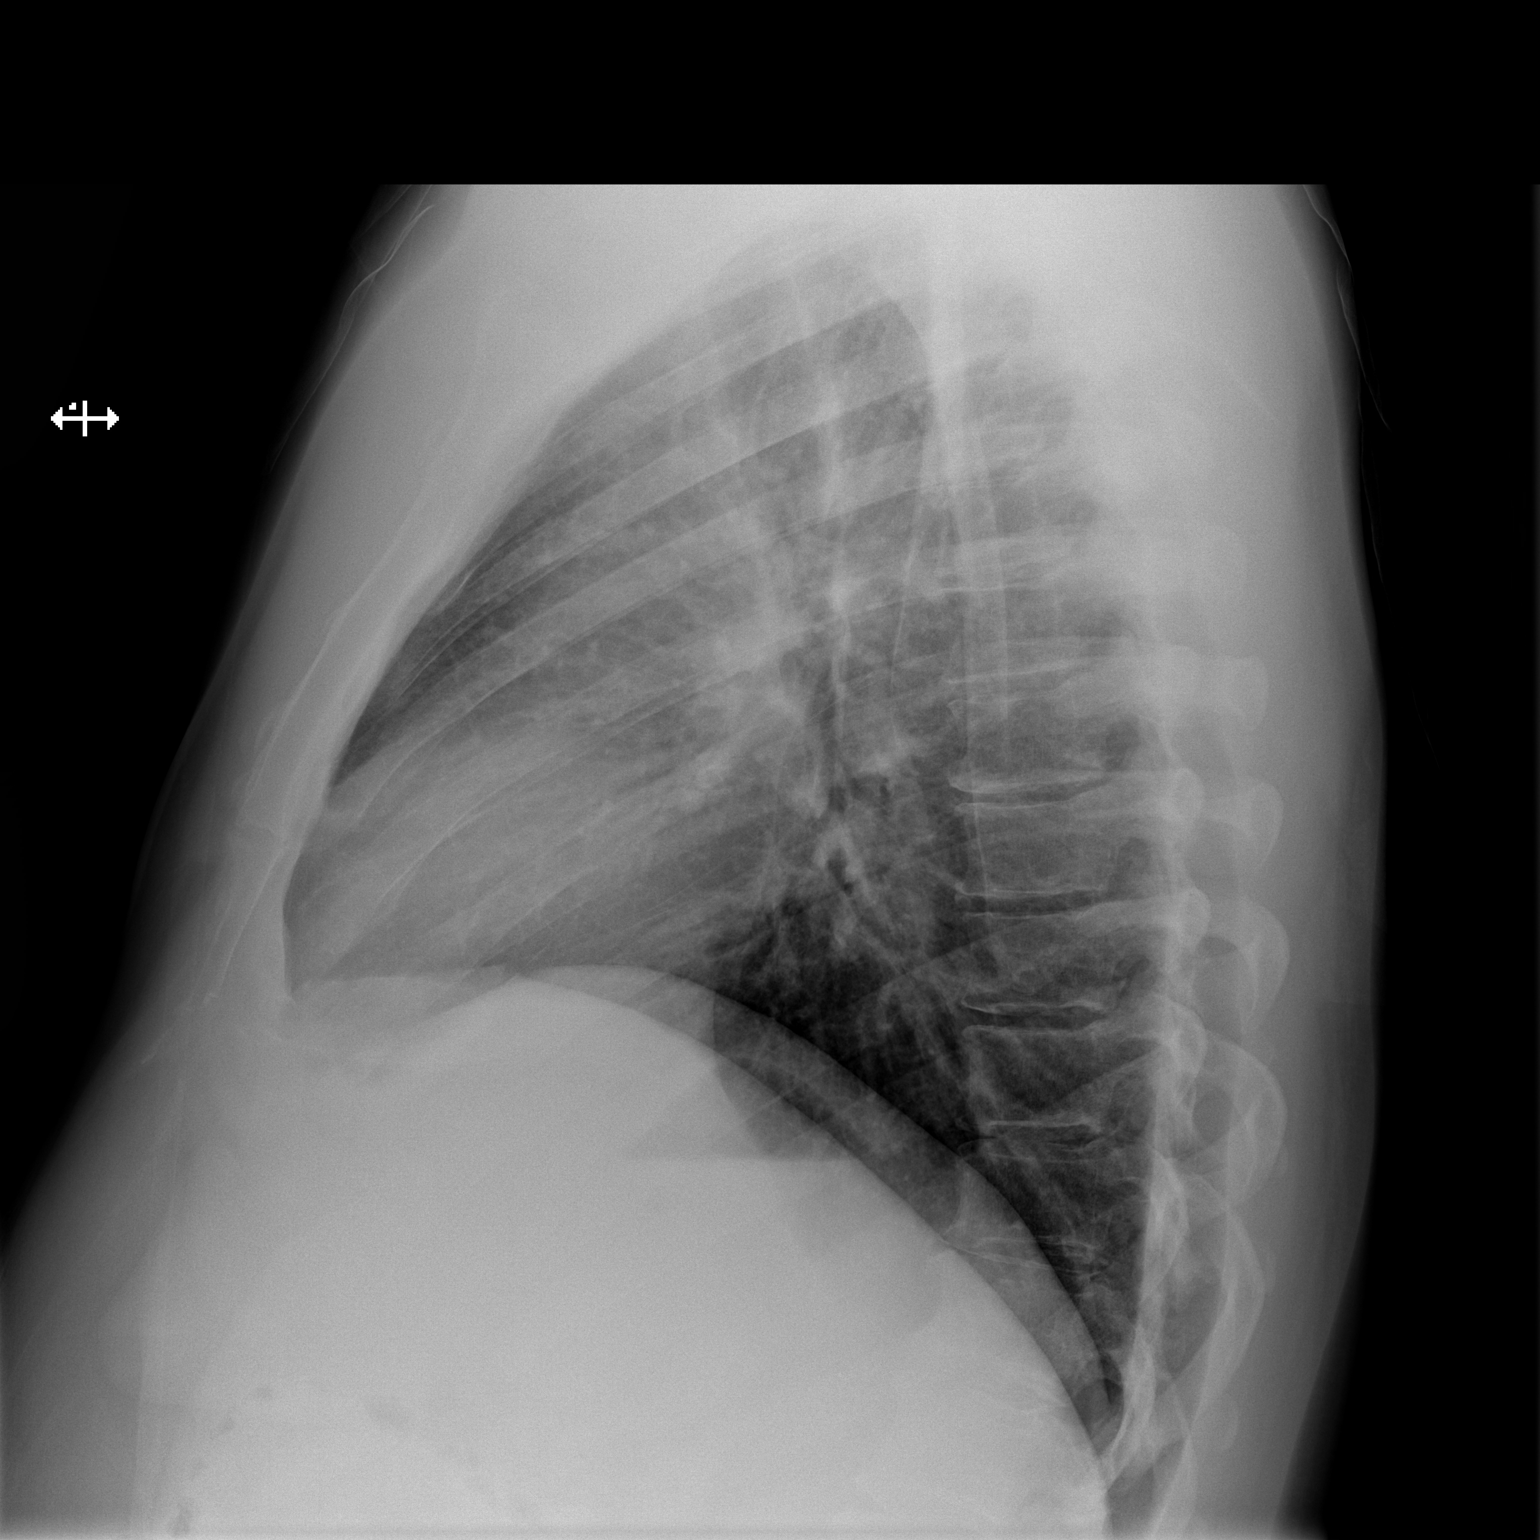

[2 of 2 positions shown; findings below may reference images not displayed]

FINDINGS: Midline trachea.  Normal heart size and mediastinal
contours. No pleural effusion or pneumothorax.  Clear lungs.
IMPRESSION: No acute cardiopulmonary disease.

## 2014-06-06 ENCOUNTER — Emergency Department (INDEPENDENT_AMBULATORY_CARE_PROVIDER_SITE_OTHER)
Admission: EM | Admit: 2014-06-06 | Discharge: 2014-06-06 | Disposition: A | Discharge status: Home / Self Care | Payer: Managed Care, Other (non HMO) | Source: Home / Self Care | Attending: Emergency Medicine | Admitting: Emergency Medicine

## 2014-06-06 ENCOUNTER — Encounter: Payer: Self-pay | Admitting: Emergency Medicine

## 2014-06-06 DIAGNOSIS — M7020 Olecranon bursitis, unspecified elbow: Secondary | ICD-10-CM

## 2014-06-06 DIAGNOSIS — M7022 Olecranon bursitis, left elbow: Secondary | ICD-10-CM

## 2014-06-06 NOTE — ED Provider Notes (Addendum)
CSN: [id number]     Arrival date & time 06/06/14  1225 History   First MD Initiated Contact with Patient 06/06/14 1236     Chief Complaint  Patient presents with  . Bursitis    L elbow   (Consider location/radiation/quality/duration/timing/severity/associated sxs/prior Treatment) HPI Over one month of intermittent swelling and pain left elbow. Recalls no injury. Pain currently 3/10. He states he saw his physician at the [facility] one week ago who did an x-ray and told him there was no fracture and diagnosis was olecranon bursitis. Past Medical History  Diagnosis Date  . Hypertension    History reviewed. No pertinent past surgical history. Family History  Problem Relation Age of Onset  . Diabetes Mother   . Hypertension Mother   . Heart attack Father   . Heart attack Paternal Grandfather    History  Substance Use Topics  . Smoking status: Never Smoker   . Smokeless tobacco: Not on file  . Alcohol Use: No    Review of Systems  All other systems reviewed and are negative.   Allergies  Review of patient's allergies indicates no known allergies.  Home Medications   Prior to Admission medications   Medication Sig Start Date End Date Taking? Authorizing Provider  amoxicillin-clavulanate (AUGMENTIN) 875-125 MG per tablet Take 1 tablet by mouth 2 (two) times daily. 08/08/13   Jade L Breeback, PA-C  fluticasone (FLONASE) 50 MCG/ACT nasal spray Place 2 sprays into the nose daily. 08/08/13   Jade L Breeback, PA-C  HYDROcodone-homatropine (HYCODAN) 5-1.5 MG/5ML syrup Take 5 mLs by mouth at bedtime as needed for cough. 08/08/13   Jade L Breeback, PA-C  lisinopril (PRINIVIL,ZESTRIL) 10 MG tablet Take 10 mg by mouth daily.    Historical Provider, MD  omeprazole (PRILOSEC) 20 MG capsule Take 20 mg by mouth daily.    Historical Provider, MD   BP 136/95  Pulse 71  Temp(Src) 98.8 F (37.1 C) (Oral)  Ht 6' (1.829 m)  Wt 246 lb 8 oz (111.812 kg)  BMI 33.42 kg/m2  SpO2 95% Physical Exam   Nursing note and vitals reviewed. Constitutional: He is oriented to person, place, and time. He appears well-developed and well-nourished. No distress.  HENT:  Head: Normocephalic and atraumatic.  Eyes: Conjunctivae and EOM are normal. Pupils are equal, round, and reactive to light. No scleral icterus.  Neck: Normal range of motion.  Cardiovascular: Normal rate.   Pulmonary/Chest: Effort normal.  Abdominal: He exhibits no distension.  Musculoskeletal: Normal range of motion.  Neurological: He is alert and oriented to person, place, and time.  Skin: Skin is warm.  Psychiatric: He has a normal mood and affect.   Left elbow: Range of motion normal. Mild swelling without fluctuance or heat or redness over olecranon bursa. Neurovascular distally intact. ED Course  Procedures (including critical care time) Labs Review Labs Reviewed - No data to display  Imaging Review No results found.   MDM   1. Olecranon bursitis, left    he declined repeating x-ray left elbow, see notes above.  I wrapped left elbow with 4 inch Ace bandage and that relieved some of the pain. He is already taking Etodolac (nsaid) prescribed by his [facility] physician, and he states that's worked fairly well as an anti-inflammatory so he declined changing that for now. Advised him to followup either with his orthopedist at [facility], or to see Dr. Benjamin Stainhekkekandam or [facility]. He voiced understanding and agreement.    Lajean Manesavid Massey,  MD 06/06/14 1744  Lajean Manes, MD 06/06/14 1745

## 2014-06-06 NOTE — ED Notes (Signed)
Pt had an xray 1 wk ago at the [facility] and he received the results saying he has olecranon bursitis.  Pt is here to see what we can do about it possibly draining it. Pain is 3/10

## 2017-01-30 ENCOUNTER — Encounter (HOSPITAL_COMMUNITY): Payer: Self-pay | Admitting: Family Medicine

## 2017-01-30 ENCOUNTER — Emergency Department (HOSPITAL_COMMUNITY)
Admission: EM | Admit: 2017-01-30 | Discharge: 2017-01-30 | Disposition: A | Discharge status: Home / Self Care | Payer: Non-veteran care | Attending: Dermatology | Admitting: Dermatology

## 2017-01-30 ENCOUNTER — Encounter (HOSPITAL_COMMUNITY): Payer: Self-pay | Admitting: Emergency Medicine

## 2017-01-30 ENCOUNTER — Ambulatory Visit (HOSPITAL_COMMUNITY)
Admission: EM | Admit: 2017-01-30 | Discharge: 2017-01-30 | Disposition: A | Discharge status: Home / Self Care | Payer: Non-veteran care | Attending: Family Medicine | Admitting: Family Medicine

## 2017-01-30 DIAGNOSIS — T783XXA Angioneurotic edema, initial encounter: Secondary | ICD-10-CM

## 2017-01-30 DIAGNOSIS — L509 Urticaria, unspecified: Secondary | ICD-10-CM

## 2017-01-30 DIAGNOSIS — T7840XA Allergy, unspecified, initial encounter: Secondary | ICD-10-CM | POA: Diagnosis not present

## 2017-01-30 DIAGNOSIS — Z5321 Procedure and treatment not carried out due to patient leaving prior to being seen by health care provider: Secondary | ICD-10-CM

## 2017-01-30 MED ORDER — FAMOTIDINE 20 MG PO TABS
20.0000 mg | ORAL_TABLET | Freq: Once | ORAL | Status: AC
  Administered 2017-01-30: 20 mg via ORAL

## 2017-01-30 MED ORDER — DEXAMETHASONE SODIUM PHOSPHATE 10 MG/ML IJ SOLN
INTRAMUSCULAR | Status: AC
  Filled 2017-01-30: qty 1

## 2017-01-30 MED ORDER — FAMOTIDINE 20 MG PO TABS
ORAL_TABLET | ORAL | Status: AC
  Filled 2017-01-30: qty 1

## 2017-01-30 MED ORDER — DIPHENHYDRAMINE HCL 50 MG/ML IJ SOLN
INTRAMUSCULAR | Status: AC
  Filled 2017-01-30: qty 1

## 2017-01-30 MED ORDER — EPINEPHRINE PF 1 MG/ML IJ SOLN
INTRAMUSCULAR | Status: AC
  Filled 2017-01-30: qty 1

## 2017-01-30 MED ORDER — EPINEPHRINE PF 1 MG/ML IJ SOLN
0.3000 mg | Freq: Once | INTRAMUSCULAR | Status: AC
  Administered 2017-01-30: 0.3 mg via INTRAMUSCULAR

## 2017-01-30 MED ORDER — DIPHENHYDRAMINE HCL 50 MG/ML IJ SOLN
25.0000 mg | Freq: Once | INTRAMUSCULAR | Status: AC
  Administered 2017-01-30: 50 mg via INTRAMUSCULAR

## 2017-01-30 MED ORDER — DEXAMETHASONE SODIUM PHOSPHATE 10 MG/ML IJ SOLN
10.0000 mg | Freq: Once | INTRAMUSCULAR | Status: AC
  Administered 2017-01-30: 10 mg via INTRAMUSCULAR

## 2017-01-30 NOTE — ED Provider Notes (Signed)
CSN: [id number]     Arrival date & time 01/30/17  1646 History   None    Chief Complaint  Patient presents with  . Rash   (Consider location/radiation/quality/duration/timing/severity/associated sxs/prior Treatment) Patient c/o facial swelling and rash over hands, buttocks, perineum, abdomen, and back.  He had this start 3 hours ago and now it is worsening.  He itches all over and feels like his mouth is swelling up.   The history is provided by the patient.  Rash  Location:  Head/neck, face, shoulder/arm, torso and leg Facial rash location:  Face, forehead, upper lip and lower lip Shoulder/arm rash location:  L shoulder, R shoulder, L arm, R arm, L axilla, R axilla, L upper arm and R upper arm Torso rash location:  L chest, R chest, L flank, R flank, upper back and lower back Leg rash location:  L upper leg and R upper leg Quality: blistering, itchiness and redness   Severity:  Severe Onset quality:  Sudden Duration:  3 hours Timing:  Constant Progression:  Worsening Chronicity:  New Relieved by:  Nothing Worsened by:  Nothing Ineffective treatments:  None tried   Past Medical History:  Diagnosis Date  . Hypertension    History reviewed. No pertinent surgical history. Family History  Problem Relation Age of Onset  . Diabetes Mother   . Hypertension Mother   . Heart attack Father   . Heart attack Paternal Grandfather    Social History  Substance Use Topics  . Smoking status: Never Smoker  . Smokeless tobacco: Never Used  . Alcohol use No    Review of Systems  Constitutional: Negative.   HENT: Negative.   Eyes: Negative.   Respiratory: Negative.   Cardiovascular: Negative.   Gastrointestinal: Negative.   Endocrine: Negative.   Genitourinary: Negative.   Musculoskeletal: Negative.   Skin: Positive for rash.  Allergic/Immunologic: Negative.   Neurological: Negative.   Hematological: Negative.   Psychiatric/Behavioral: Negative.     Allergies  Patient  has no known allergies.  Home Medications   Prior to Admission medications   Medication Sig Start Date End Date Taking? Authorizing Provider  amoxicillin-clavulanate (AUGMENTIN) 875-125 MG per tablet Take 1 tablet by mouth 2 (two) times daily. 08/08/13   Jade L Breeback, PA-C  fluticasone (FLONASE) 50 MCG/ACT nasal spray Place 2 sprays into the nose daily. 08/08/13   Jade L Breeback, PA-C  HYDROcodone-homatropine (HYCODAN) 5-1.5 MG/5ML syrup Take 5 mLs by mouth at bedtime as needed for cough. 08/08/13   Jade L Breeback, PA-C  lisinopril (PRINIVIL,ZESTRIL) 10 MG tablet Take 10 mg by mouth daily.    Historical Provider, MD  omeprazole (PRILOSEC) 20 MG capsule Take 20 mg by mouth daily.    Historical Provider, MD   Meds Ordered and Administered this Visit   Medications  dexamethasone (DECADRON) injection 10 mg (10 mg Intramuscular Given 01/30/17 1732)  famotidine (PEPCID) tablet 20 mg (20 mg Oral Given 01/30/17 1733)  diphenhydrAMINE (BENADRYL) injection 25 mg (50 mg Intramuscular Given 01/30/17 1733)  EPINEPHrine (ADRENALIN) 0.3 mg (0.3 mg Intramuscular Given 01/30/17 1733)    BP (!) 152/91   Pulse 72   Temp 98.5 F (36.9 C) (Oral)   Resp 18   SpO2 95%  No data found.   Physical Exam  Constitutional: He appears well-developed and well-nourished.  HENT:  Head: Normocephalic and atraumatic.  Eyes: Conjunctivae and EOM are normal. Pupils are equal, round, and reactive to light.  Neck: Normal range of motion. Neck supple.  Cardiovascular: Normal rate, regular rhythm and normal heart sounds.   Pulmonary/Chest: Effort normal and breath sounds normal.  Skin: There is erythema.  Urticaria rash on bilateral flanks and abdomen, chest, back, hands red and sore, peri with confluent rash, face with rash and angioedema, and axilla erythematous.  Nursing note and vitals reviewed.   Urgent Care Course     Procedures (including critical care time)  Labs Review Labs Reviewed - No data to  display  Imaging Review No results found.   Visual Acuity Review  Right Eye Distance:   Left Eye Distance:   Bilateral Distance:    Right Eye Near:   Left Eye Near:    Bilateral Near:         MDM   1. Urticaria   2. Angioedema, initial encounter    Epi 0.3mg IM Benadryl 50mg  IM Famotidine 20mg  po  Decadron 10mg  IM  After 10 minutes patient w/o feeling of mouth swelling and itching is resolving.  Recommend DC and follow up in ED for monitoring      Deatra Canter, FNP 01/30/17 1755

## 2017-01-30 NOTE — ED Triage Notes (Signed)
[facility] reaction with [facility]; pt given meds and now improved; pt sent for further eval

## 2017-01-30 NOTE — ED Notes (Signed)
No answer in lobby when called for room x 3

## 2017-01-30 NOTE — ED Triage Notes (Signed)
Pt here for welts all over body that started today. sts unsure of cause.

## 2017-02-01 NOTE — ED Provider Notes (Signed)
It appears, from nursing notes, patient left without being seen.    Ricky MemosJason Lisett Dirusso, MD 02/01/17 [phone]

## 2021-06-06 ENCOUNTER — Ambulatory Visit
Admission: EM | Admit: 2021-06-06 | Discharge: 2021-06-06 | Disposition: A | Discharge status: Home / Self Care | Payer: Non-veteran care | Attending: Emergency Medicine | Admitting: Emergency Medicine

## 2021-06-06 ENCOUNTER — Encounter: Payer: Self-pay | Admitting: Emergency Medicine

## 2021-06-06 ENCOUNTER — Other Ambulatory Visit: Payer: Self-pay

## 2021-06-06 DIAGNOSIS — B349 Viral infection, unspecified: Secondary | ICD-10-CM | POA: Diagnosis not present

## 2021-06-06 DIAGNOSIS — Z20822 Contact with and (suspected) exposure to covid-19: Secondary | ICD-10-CM | POA: Diagnosis not present

## 2021-06-06 DIAGNOSIS — U071 COVID-19: Secondary | ICD-10-CM

## 2021-06-06 MED ORDER — BENZONATATE 200 MG PO CAPS: 200.0000 mg | ORAL_CAPSULE | Freq: Three times a day (TID) | ORAL | 0 refills | Status: DC | PRN

## 2021-06-06 MED ORDER — ONDANSETRON 8 MG PO TBDP: ORAL_TABLET | ORAL | 0 refills | Status: DC

## 2021-06-06 MED ORDER — IBUPROFEN 600 MG PO TABS: 600.0000 mg | ORAL_TABLET | Freq: Four times a day (QID) | ORAL | 0 refills | Status: DC | PRN

## 2021-06-06 NOTE — ED Triage Notes (Signed)
A week ago, patient had a one day issue with chills and diarrhea.   Symptoms returned 5 days ago. Ongoing fever, chills,  congestion, cough and diarrhea

## 2021-06-06 NOTE — Discharge Instructions (Addendum)
Push plenty of electrolyte containing fluids such as Pedialyte or Gatorade.  Take 600 mg of ibuprofen combined with 1000 mg of Tylenol together 3-4 times a day as needed for fever, body aches.  Zofran for nausea, diarrhea, start some probiotics as well.  Any brand is fine.  Tessalon for the cough.  We will contact you if your COVID is positive and will prescribe Molnupiravir if it comes back in time.

## 2021-06-06 NOTE — ED Provider Notes (Addendum)
HPI  SUBJECTIVE:  Ricky Barrett is a 55 y.o. male who presents with 4 days of fevers T-max 100.3, body aches, headaches, postnasal drip, sore throat secondary to the postnasal drip, cough.  No nasal congestion or rhinorrhea, loss of sense of smell or taste, shortness of breath, nausea, vomiting.  He has had 1-2 episodes of watery, nonbloody diarrhea per day.  No change in urine output.  He is able to sleep at night with NyQuil.  He did not get the COVID-vaccine.  No known COVID exposure.  No abdominal pain, recent travel, raw or undercooked foods, questionable leftovers, recent antibiotics.  No contacts with similar diarrheal symptoms.  No antipyretic in the past 6 hours.  He tried Tylenol, NyQuil without improvement in his symptoms.  No aggravating factors.  He has a past medical history of hypertension, GERD.  No history of diabetes, abdominal surgeries.  PMD: [facility].    Past Medical History:  Diagnosis Date   Hypertension     History reviewed. No pertinent surgical history.  Family History  Problem Relation Age of Onset   Diabetes Mother    Hypertension Mother    Heart attack Father    Heart attack Paternal Grandfather     Social History   Tobacco Use   Smoking status: Never   Smokeless tobacco: Never  Substance Use Topics   Alcohol use: No   Drug use: No    No current facility-administered medications for this encounter.  Current Outpatient Medications:    benzonatate (TESSALON) 200 MG capsule, Take 1 capsule (200 mg total) by mouth 3 (three) times daily as needed for cough., Disp: 30 capsule, Rfl: 0   ibuprofen (ADVIL) 600 MG tablet, Take 1 tablet (600 mg total) by mouth every 6 (six) hours as needed., Disp: 30 tablet, Rfl: 0   lisinopril (PRINIVIL,ZESTRIL) 10 MG tablet, Take 10 mg by mouth daily., Disp: , Rfl:    omeprazole (PRILOSEC) 20 MG capsule, Take 20 mg by mouth daily., Disp: , Rfl:    ondansetron (ZOFRAN ODT) 8 MG disintegrating tablet, 1/2- 1 tablet  q 8 hr prn nausea, vomiting, Disp: 20 tablet, Rfl: 0   fluticasone (FLONASE) 50 MCG/ACT nasal spray, Place 2 sprays into the nose daily., Disp: 16 g, Rfl: 0  No Known Allergies   ROS  As noted in HPI.   Physical Exam  BP (!) 158/105   Pulse 74   Temp 100.3 F (37.9 C) (Oral)   Resp 16   SpO2 95%   Constitutional: Well developed, well nourished, no acute distress Eyes: PERRL, EOMI, conjunctiva normal bilaterally HENT: Normocephalic, atraumatic,mucus membranes moist.  Mild nasal congestion.  No maxillary, frontal sinus tenderness.  Erythematous, swollen.  Normal oropharynx, tonsils normal without exudates.  No postnasal drip. Neck: No cervical lymphadenopathy Respiratory: Clear to auscultation bilaterally, no rales, no wheezing, no rhonchi Cardiovascular: Normal rate and rhythm, no murmurs, no gallops, no rubs GI: Soft, nondistended, normal bowel sounds, nontender, no rebound, no guarding skin: No rash, skin intact Musculoskeletal: No edema, no tenderness, no deformities Neurologic: Alert & oriented x 3, CN III-XII grossly intact, no motor deficits, sensation grossly intact Psychiatric: Speech and behavior appropriate   ED Course   Medications - No data to display  Orders Placed This Encounter  Procedures   Novel Coronavirus, NAA ([facility])    Standing Status:   Standing    Number of Occurrences:   1   SARS-COV-2, NAA 2 DAY TAT    Standing Status:  Standing    Number of Occurrences:   1   Results for orders placed or performed during the hospital encounter of 06/06/21 (from the past 24 hour(s))  Novel Coronavirus, NAA ([facility])     Status: Abnormal   Collection Time: 06/06/21 12:00 PM   Specimen: Nasopharyngeal Swab; Nasopharyngeal(NP) swabs in vial transport medium   Nasopharynge  Result Value Ref Range   SARS-CoV-2, NAA Detected (A) Not Detected   Narrative   Performed at:  [address] [place], [place]  [id number] Lab Director: Jolene Schimke MD, Phone:  [phone]  SARS-COV-2, NAA 2 DAY TAT     Status: None   Collection Time: 06/06/21 12:00 PM   Nasopharynge  Result Value Ref Range   SARS-CoV-2, NAA 2 DAY TAT Performed    Narrative   Performed at:  [address] [employer] [address] [place], [place]  [id number] Lab Director: Jolene Schimke MD, Phone:  [phone]   No results found. Results for orders placed or performed during the hospital encounter of 06/06/21  Novel Coronavirus, NAA ([facility])   Specimen: Nasopharyngeal Swab; Nasopharyngeal(NP) swabs in vial transport medium   Nasopharynge  Result Value Ref Range   SARS-CoV-2, NAA Detected (A) Not Detected  SARS-COV-2, NAA 2 DAY TAT   Nasopharynge  Result Value Ref Range   SARS-CoV-2, NAA 2 DAY TAT Performed     ED Clinical Impression  1. COVID-19 virus infection   2. Viral illness   3. Encounter for laboratory testing for COVID-19 virus      ED Assessment/Plan  Presentation consistent with COVID.  Patient has a low-grade fever.  He appears nontoxic, abdomen benign.  Will check COVID PCR.  He has been symptomatic for 4 days, so if it comes back within the next 24 hours, he will be a candidate for antiviral therapy based on his unvaccinated status.  He has opted to be prescribed Molnupiravir, declined labs.  Home with Tylenol/ibuprofen, Zofran, probiotics, Tessalon.  Follow-up with PMD in several days, ER return precautions given.  COVID pending at the time of signing of this note  Addendum 7/26 0745-COVID-positive.  Will prescribe Molnupiravir to pharmacy on record.  See telephone note.  will have staff notify patient of lab result and prescription awaiting him.  MyChart note also sent.  Discussed labs, MDM, treatment plan, and plan for follow-up with patient Discussed sn/sx that should prompt return to the ED. patient agrees with plan.   Meds ordered this encounter  Medications   ondansetron (ZOFRAN ODT) 8 MG disintegrating tablet    Sig: 1/2- 1  tablet q 8 hr prn nausea, vomiting    Dispense:  20 tablet    Refill:  0   benzonatate (TESSALON) 200 MG capsule    Sig: Take 1 capsule (200 mg total) by mouth 3 (three) times daily as needed for cough.    Dispense:  30 capsule    Refill:  0   ibuprofen (ADVIL) 600 MG tablet    Sig: Take 1 tablet (600 mg total) by mouth every 6 (six) hours as needed.    Dispense:  30 tablet    Refill:  0      *This clinic note was created using Scientist, clinical (histocompatibility and immunogenetics). Therefore, there may be occasional mistakes despite careful proofreading. ?    Domenick Gong, MD 06/06/21 Windy Carina    Domenick Gong, MD 06/07/21 9937    Domenick Gong, MD 06/07/21 [phone]

## 2021-06-07 ENCOUNTER — Telehealth: Payer: Self-pay | Admitting: Emergency Medicine

## 2021-06-07 ENCOUNTER — Telehealth (HOSPITAL_COMMUNITY): Payer: Self-pay | Admitting: Emergency Medicine

## 2021-06-07 DIAGNOSIS — U071 COVID-19: Secondary | ICD-10-CM

## 2021-06-07 LAB — NOVEL CORONAVIRUS, NAA: SARS-CoV-2, NAA: DETECTED — AB

## 2021-06-07 LAB — SARS-COV-2, NAA 2 DAY TAT

## 2021-06-07 MED ORDER — MOLNUPIRAVIR EUA 200MG CAPSULE: 4.0000 | ORAL_CAPSULE | Freq: Two times a day (BID) | ORAL | 0 refills | Status: DC

## 2021-06-07 MED ORDER — MOLNUPIRAVIR EUA 200MG CAPSULE: 4.0000 | ORAL_CAPSULE | Freq: Two times a day (BID) | ORAL | 0 refills | Status: AC

## 2021-06-07 NOTE — Telephone Encounter (Signed)
Antiviral sent to [facility]

## 2021-06-07 NOTE — Telephone Encounter (Signed)
Patient COVID-positive.  Have E prescribed Molnupiravir to pharmacy on record.  will have staff notify patient.

## 2021-06-07 NOTE — Addendum Note (Signed)
Addended by: Bing Neighbors on: 06/07/2021 09:53 AM   Modules accepted: Orders

## 2021-09-07 ENCOUNTER — Other Ambulatory Visit: Payer: Self-pay | Admitting: Family Medicine

## 2021-09-07 DIAGNOSIS — M7989 Other specified soft tissue disorders: Secondary | ICD-10-CM

## 2021-09-22 ENCOUNTER — Ambulatory Visit: Payer: Non-veteran care

## 2021-10-03 ENCOUNTER — Ambulatory Visit: Admission: RE | Admit: 2021-10-03 | Payer: Non-veteran care | Source: Ambulatory Visit

## 2021-12-25 ENCOUNTER — Ambulatory Visit
Admission: EM | Admit: 2021-12-25 | Discharge: 2021-12-25 | Disposition: A | Discharge status: Home / Self Care | Payer: Non-veteran care | Attending: Emergency Medicine | Admitting: Emergency Medicine

## 2021-12-25 ENCOUNTER — Other Ambulatory Visit: Payer: Self-pay

## 2021-12-25 DIAGNOSIS — R197 Diarrhea, unspecified: Secondary | ICD-10-CM

## 2021-12-25 DIAGNOSIS — B349 Viral infection, unspecified: Secondary | ICD-10-CM

## 2021-12-25 NOTE — ED Triage Notes (Signed)
Pt also reports diarrhea stools.

## 2021-12-25 NOTE — ED Provider Notes (Signed)
Ricky Barrett    CSN: [id number] Arrival date & time: 12/25/21  2035      History   Chief Complaint Chief Complaint  Patient presents with   Fever    CHILLS   Diarrhea    HPI Ricky Barrett is a 56 y.o. male. Patient presents with fever, chills, cough, and diarrhea since yesterday.  He did not take his temperature but felt warm and cold.  Treatment at home with Tylenol.  He denies rash, ear pain, sore throat, chest pain, shortness of breath, abdominal pain, nausea, vomiting, or other symptoms.  His medical history includes hypertension.    The history is provided by the patient and medical records.   Past Medical History:  Diagnosis Date   Hypertension     Patient Active Problem List   Diagnosis Date Noted   GERD (gastroesophageal reflux disease) 08/08/2013   ERECTILE DYSFUNCTION 01/07/2009   DYSLIPIDEMIA 11/11/2008   ESSENTIAL HYPERTENSION, BENIGN 11/11/2008   KNEE PAIN, LEFT 11/11/2008    History reviewed. No pertinent surgical history.     Home Medications    Prior to Admission medications   Medication Sig Start Date End Date Taking? Authorizing Provider  benzonatate (TESSALON) 200 MG capsule Take 1 capsule (200 mg total) by mouth 3 (three) times daily as needed for cough. 06/06/21   Domenick Gong, MD  fluticasone (FLONASE) 50 MCG/ACT nasal spray Place 2 sprays into the nose daily. 08/08/13   Breeback, Jade L, PA-C  ibuprofen (ADVIL) 600 MG tablet Take 1 tablet (600 mg total) by mouth every 6 (six) hours as needed. 06/06/21   Domenick Gong, MD  lisinopril (PRINIVIL,ZESTRIL) 10 MG tablet Take 10 mg by mouth daily.    [provider]  omeprazole (PRILOSEC) 20 MG capsule Take 20 mg by mouth daily.    [provider]  ondansetron (ZOFRAN ODT) 8 MG disintegrating tablet 1/2- 1 tablet q 8 hr prn nausea, vomiting 06/06/21   Domenick Gong, MD    Family History Family History  Problem Relation Age of Onset   Diabetes Mother     Hypertension Mother    Heart attack Father    Heart attack Paternal Grandfather     Social History Social History   Tobacco Use   Smoking status: Never   Smokeless tobacco: Never  Substance Use Topics   Alcohol use: No   Drug use: No     Allergies   Patient has no known allergies.   Review of Systems Review of Systems  Constitutional:  Positive for chills and fever.  HENT:  Negative for ear pain and sore throat.   Respiratory:  Positive for cough. Negative for shortness of breath.   Cardiovascular:  Negative for chest pain and palpitations.  Gastrointestinal:  Positive for diarrhea. Negative for abdominal pain, nausea and vomiting.  Skin:  Negative for color change and rash.  All other systems reviewed and are negative.   Physical Exam Triage Vital Signs ED Triage Vitals [12/25/21 0950]  Enc Vitals Group     BP (!) 125/95     Pulse Rate (!) 105     Resp 16     Temp 98.5 F (36.9 C)     Temp Source Oral     SpO2 95 %     Weight      Height      Head Circumference      Peak Flow      Pain Score      Pain Loc  Pain Edu?      Excl. in GC?    No data found.  Updated Vital Signs BP (!) 125/95 (BP Location: Left Arm)    Pulse (!) 105    Temp 98.5 F (36.9 C) (Oral)    Resp 16    SpO2 95%   Visual Acuity Right Eye Distance:   Left Eye Distance:   Bilateral Distance:    Right Eye Near:   Left Eye Near:    Bilateral Near:     Physical Exam Vitals and nursing note reviewed.  Constitutional:      General: He is not in acute distress.    Appearance: Normal appearance. He is well-developed. He is not ill-appearing.  HENT:     Right Ear: Tympanic membrane normal.     Left Ear: Tympanic membrane normal.     Nose: Nose normal.     Mouth/Throat:     Mouth: Mucous membranes are moist.     Pharynx: Oropharynx is clear.  Cardiovascular:     Rate and Rhythm: Normal rate and regular rhythm.     Heart sounds: Normal heart sounds.  Pulmonary:     Effort:  Pulmonary effort is normal. No respiratory distress.     Breath sounds: Normal breath sounds.  Abdominal:     General: Bowel sounds are normal.     Palpations: Abdomen is soft.     Tenderness: There is no abdominal tenderness. There is no guarding or rebound.  Musculoskeletal:     Cervical back: Neck supple.  Skin:    General: Skin is warm and dry.  Neurological:     Mental Status: He is alert.  Psychiatric:        Mood and Affect: Mood normal.        Behavior: Behavior normal.     UC Treatments / Results  Labs (all labs ordered are listed, but only abnormal results are displayed) Labs Reviewed  COVID-19, FLU A+B NAA    EKG   Radiology No results found.  Procedures Procedures (including critical care time)  Medications Ordered in UC Medications - No data to display  Initial Impression / Assessment and Plan / UC Course  I have reviewed the triage vital signs and the nursing notes.  Pertinent labs & imaging results that were available during my care of the patient were reviewed by me and considered in my medical decision making (see chart for details).   Viral illness, diarrhea.  COVID and flu pending.  Instructed patient to self quarantine per CDC guidelines.  Diarrhea diet discussed.  Discussed symptomatic treatment including Tylenol or ibuprofen, rest, hydration.  Instructed patient to follow up with PCP if symptoms are not improving.  Patient agrees to plan of care.    Final Clinical Impressions(s) / UC Diagnoses   Final diagnoses:  Viral illness  Diarrhea, unspecified type     Discharge Instructions      Your COVID and Flu tests are pending.  You should self quarantine until the test results are back.    Follow the attached diarrhea diet. Take Tylenol or ibuprofen as needed for fever or discomfort.  Rest and keep yourself hydrated.    Follow-up with your primary care provider if your symptoms are not improving.         ED Prescriptions   None     PDMP not reviewed this encounter.   Mickie Bail, NP 12/25/21 1039

## 2021-12-25 NOTE — Discharge Instructions (Addendum)
Your COVID and Flu tests are pending.  You should self quarantine until the test results are back.    Follow the attached diarrhea diet. Take Tylenol or ibuprofen as needed for fever or discomfort.  Rest and keep yourself hydrated.    Follow-up with your primary care provider if your symptoms are not improving.

## 2021-12-25 NOTE — ED Triage Notes (Signed)
Pt presents to the office for chills,fever and cough x 1-2 days.

## 2021-12-26 LAB — COVID-19, FLU A+B NAA
Influenza A, NAA: NOT DETECTED
Influenza B, NAA: NOT DETECTED
SARS-CoV-2, NAA: NOT DETECTED

## 2022-11-03 ENCOUNTER — Ambulatory Visit: Payer: Non-veteran care | Admitting: Cardiology

## 2022-11-24 ENCOUNTER — Encounter: Payer: Self-pay | Admitting: Cardiology

## 2022-11-24 ENCOUNTER — Ambulatory Visit: Payer: No Typology Code available for payment source | Attending: Cardiology | Admitting: Cardiology

## 2022-11-24 VITALS — BP 132/100 | HR 76 | Ht 73.0 in | Wt 250.2 lb

## 2022-11-24 DIAGNOSIS — R9431 Abnormal electrocardiogram [ECG] [EKG]: Secondary | ICD-10-CM | POA: Diagnosis not present

## 2022-11-24 DIAGNOSIS — I10 Essential (primary) hypertension: Secondary | ICD-10-CM | POA: Diagnosis not present

## 2022-11-24 DIAGNOSIS — E782 Mixed hyperlipidemia: Secondary | ICD-10-CM

## 2022-11-24 NOTE — Progress Notes (Signed)
Cardiology Office Note:    Date:  11/24/2022   ID:  Ricky Barrett, DOB November 28, 1965, MRN [id number]  PCP:  Dorene Grebe, MD   [facility] Providers Cardiologist:  Debbe Odea, MD     Referring MD: Dorene Grebe, MD   Chief Complaint  Patient presents with   New Patient (Initial Visit)    Atherosclerosis, abnormal EKG, Right leg swelling     History of Present Illness:    Ricky Barrett is a 57 y.o. male with a hx of hypertension, hyperlipidemia presenting with abnormal EKG.  Patient follows up at the [facility] for medical care.  Echocardiogram obtained by PCP during last visit noted to be abnormal.  He denies chest pain or shortness of breath.  Endorses having right lower extremity edema, varicose veins.  Legs usually swells up after he has been on his feet for long.  Left leg is okay.  His father has a history of coronary stent, father was also smoker.  Recently diagnosed with hyperlipidemia, started on simvastatin 2 months ago.  Past Medical History:  Diagnosis Date   Hypertension     History reviewed. No pertinent surgical history.  Current Medications: Current Meds  Medication Sig   aspirin EC 81 MG tablet Take 81 mg by mouth daily.   CIALIS 20 MG tablet Take 20 mg by mouth daily as needed.   clotrimazole (LOTRIMIN) 1 % cream Apply 1 Application topically 2 (two) times daily.   ibuprofen (ADVIL) 600 MG tablet Take 1 tablet (600 mg total) by mouth every 6 (six) hours as needed.   ketoconazole (NIZORAL) 2 % shampoo Apply 1 Application topically daily.   lisinopril (PRINIVIL,ZESTRIL) 10 MG tablet Take 10 mg by mouth daily.   omeprazole (PRILOSEC) 20 MG capsule Take 20 mg by mouth daily.   simvastatin (ZOCOR) 10 MG tablet Take 10 mg by mouth daily at 6 PM.     Allergies:   Patient has no known allergies.   Social History   Socioeconomic History   Marital status: Married    Spouse name: Not on file   Number of children: Not on file   Years of  education: Not on file   Highest education level: Not on file  Occupational History   Not on file  Tobacco Use   Smoking status: Never    Passive exposure: Past   Smokeless tobacco: Never  Substance and Sexual Activity   Alcohol use: No   Drug use: No   Sexual activity: Not on file  Other Topics Concern   Not on file  Social History Narrative   Not on file   Social Determinants of Health   Financial Resource Strain: Not on file  Food Insecurity: Not on file  Transportation Needs: Not on file  Physical Activity: Not on file  Stress: Not on file  Social Connections: Not on file     Family History: The patient's family history includes Diabetes in his mother; Heart attack in his father and paternal grandfather; Hypertension in his mother.  ROS:   Please see the history of present illness.     All other systems reviewed and are negative.  EKGs/Labs/Other Studies Reviewed:    The following studies were reviewed today:   EKG:  EKG is  ordered today.  The ekg ordered today demonstrates sinus rhythm, possible old inferior infarct.  Recent Labs: No results found for requested labs within last 365 days.  Recent Lipid Panel    Component Value Date/Time  CHOL 248 (H) 11/11/2008 2041   TRIG 253 (H) 11/11/2008 2041   HDL 40 11/11/2008 2041   CHOLHDL 6.2 Ratio 11/11/2008 2041   VLDL 51 (H) 11/11/2008 2041   LDLCALC 157 (H) 11/11/2008 2041     Risk Assessment/Calculations:     HYPERTENSION CONTROL Vitals:   11/24/22 1317 11/24/22 1318  BP: (!) 146/90 (!) 132/100    The patient's blood pressure is elevated above target today.  In order to address the patient's elevated BP: Blood pressure will be monitored at home to determine if medication changes need to be made.            Physical Exam:    VS:  BP (!) 132/100 (BP Location: Right Arm, Patient Position: Sitting, Cuff Size: Large)   Pulse 76   Ht 6\' 1"  (1.854 m)   Wt 250 lb 3.2 oz (113.5 kg)   SpO2 94%    BMI 33.01 kg/m     Wt Readings from Last 3 Encounters:  11/24/22 250 lb 3.2 oz (113.5 kg)  06/06/14 246 lb 8 oz (111.8 kg)  08/08/13 252 lb (114.3 kg)     GEN:  Well nourished, well developed in no acute distress HEENT: Normal NECK: No JVD; No carotid bruits CARDIAC: RRR, no murmurs, rubs, gallops RESPIRATORY:  Clear to auscultation without rales, wheezing or rhonchi  ABDOMEN: Soft, non-tender, non-distended MUSCULOSKELETAL:  No edema; No deformity  SKIN: Warm and dry NEUROLOGIC:  Alert and oriented x 3 PSYCHIATRIC:  Normal affect   ASSESSMENT:    1. Abnormal EKG   2. Primary hypertension   3. Mixed hyperlipidemia    PLAN:    In order of problems listed above:  Abnormal EKG, possible old inferior infarct.  Patient denies chest pain or shortness of breath.  Obtain echo to evaluate any structural abnormalities. Hypertension, BP elevated.  Low-salt diet, monitor BP at home and keep log.  Continue lisinopril 10 mg daily.  If BP elevated at follow-up visit, titrate lisinopril to 20 mg. Hyperlipidemia, recently started on simvastatin.  Obtain fasting lipid profile.  Titrate statin based on lipid results.  Follow-up after echocardiogram      Medication Adjustments/Labs and Tests Ordered: Current medicines are reviewed at length with the patient today.  Concerns regarding medicines are outlined above.  Orders Placed This Encounter  Procedures   Lipid panel   EKG 12-Lead   ECHOCARDIOGRAM COMPLETE   No orders of the defined types were placed in this encounter.   Patient Instructions  Medication Instructions:   Your physician recommends that you continue on your current medications as directed. Please refer to the Current Medication list given to you today.  *If you need a refill on your cardiac medications before your next appointment, please call your pharmacy*   Lab Work:  Your physician recommends that you return for lab work When you are fasting at the medical  mall.  No appt is needed. Hours are M-F 7AM- 6 PM.  If you have labs (blood work) drawn today and your tests are completely normal, you will receive your results only by: [place] (if you have MyChart) OR A paper copy in the mail If you have any lab test that is abnormal or we need to change your treatment, we will call you to review the results.   Testing/Procedures:  Echocardiogram   Your physician has requested that you have an echocardiogram. Echocardiography is a painless test that uses sound waves to create images of your heart.  It provides your doctor with information about the size and shape of your heart and how well your heart's chambers and valves are working. This procedure takes approximately one hour. There are no restrictions for this procedure. Please note; depending on visual quality an IV may need to be placed.     Follow-Up: At [facility], you and your health needs are our priority.  As part of our continuing mission to provide you with exceptional heart care, we have created designated Provider Care Teams.  These Care Teams include your primary Cardiologist (physician) and Advanced Practice Providers (APPs -  Physician Assistants and Nurse Practitioners) who all work together to provide you with the care you need, when you need it.  We recommend signing up for the patient portal called "MyChart".  Sign up information is provided on this After Visit Summary.  MyChart is used to connect with patients for Virtual Visits (Telemedicine).  Patients are able to view lab/test results, encounter notes, upcoming appointments, etc.  Non-urgent messages can be sent to your provider as well.   To learn more about what you can do with MyChart, go to [email or web address].    Your next appointment:    After Echocardiogram  Provider:   You may see Kate Sable, MD or one of the following Advanced Practice Providers on your designated Care Team:   Murray Hodgkins, NP Christell Faith, PA-C Cadence Kathlen Mody, PA-C Gerrie Nordmann, NP    Signed, Kate Sable, MD  11/24/2022 1:59 PM    [place]

## 2022-11-24 NOTE — Patient Instructions (Signed)
Medication Instructions:   Your physician recommends that you continue on your current medications as directed. Please refer to the Current Medication list given to you today.  *If you need a refill on your cardiac medications before your next appointment, please call your pharmacy*   Lab Work:  Your physician recommends that you return for lab work When you are fasting at the medical mall.  No appt is needed. Hours are M-F 7AM- 6 PM.  If you have labs (blood work) drawn today and your tests are completely normal, you will receive your results only by: [place] (if you have MyChart) OR A paper copy in the mail If you have any lab test that is abnormal or we need to change your treatment, we will call you to review the results.   Testing/Procedures:  Echocardiogram   Your physician has requested that you have an echocardiogram. Echocardiography is a painless test that uses sound waves to create images of your heart. It provides your doctor with information about the size and shape of your heart and how well your heart's chambers and valves are working. This procedure takes approximately one hour. There are no restrictions for this procedure. Please note; depending on visual quality an IV may need to be placed.     Follow-Up: At [facility], you and your health needs are our priority.  As part of our continuing mission to provide you with exceptional heart care, we have created designated Provider Care Teams.  These Care Teams include your primary Cardiologist (physician) and Advanced Practice Providers (APPs -  Physician Assistants and Nurse Practitioners) who all work together to provide you with the care you need, when you need it.  We recommend signing up for the patient portal called "MyChart".  Sign up information is provided on this After Visit Summary.  MyChart is used to connect with patients for Virtual Visits (Telemedicine).  Patients are able to view lab/test  results, encounter notes, upcoming appointments, etc.  Non-urgent messages can be sent to your provider as well.   To learn more about what you can do with MyChart, go to [email or web address].    Your next appointment:    After Echocardiogram  Provider:   You may see Kate Sable, MD or one of the following Advanced Practice Providers on your designated Care Team:   Murray Hodgkins, NP Christell Faith, PA-C Cadence Kathlen Mody, PA-C Gerrie Nordmann, NP

## 2022-12-08 ENCOUNTER — Ambulatory Visit: Payer: Non-veteran care | Admitting: Cardiology

## 2022-12-12 ENCOUNTER — Ambulatory Visit
Admission: EM | Admit: 2022-12-12 | Discharge: 2022-12-12 | Disposition: A | Discharge status: Home / Self Care | Payer: No Typology Code available for payment source | Attending: Family Medicine | Admitting: Family Medicine

## 2022-12-12 DIAGNOSIS — J111 Influenza due to unidentified influenza virus with other respiratory manifestations: Secondary | ICD-10-CM

## 2022-12-12 LAB — POCT INFLUENZA A/B
Influenza A, POC: POSITIVE — AB
Influenza B, POC: NEGATIVE

## 2022-12-12 LAB — POC SARS CORONAVIRUS 2 AG -  ED: SARS Coronavirus 2 Ag: NEGATIVE

## 2022-12-12 MED ORDER — OSELTAMIVIR PHOSPHATE 75 MG PO CAPS: 75.0000 mg | ORAL_CAPSULE | Freq: Two times a day (BID) | ORAL | 0 refills | Status: DC

## 2022-12-12 NOTE — Discharge Instructions (Signed)
Take Tamiflu 2 times a day for 5 days Drink lots of water May take over-the-counter cough and cold medicines as needed Take ibuprofen or Tylenol for any pain and fever Call for problems  (Dr. Madilyn Fireman called in Tamiflu for your wife to take to prevent catching flu)

## 2022-12-12 NOTE — ED Provider Notes (Signed)
[facility]    CSN: [id number] Arrival date & time: 12/12/22  0809      History   Chief Complaint Chief Complaint  Patient presents with   Cough   Fever   Chills    HPI Ricky Barrett is a 57 y.o. male.   HPI  Patient on his fourth, fifth day of illness.  He has had fever and chills, cough, fatigue, headache and bodyaches.  He has been taking over-the-counter cough and cold medicines TheraFlu and Tylenol.  He is here hoping to find out why he feels so bad.  He is generally in good health.  No underlying asthma or lung disease  Past Medical History:  Diagnosis Date   Hypertension     Patient Active Problem List   Diagnosis Date Noted   GERD (gastroesophageal reflux disease) 08/08/2013   ERECTILE DYSFUNCTION 01/07/2009   DYSLIPIDEMIA 11/11/2008   ESSENTIAL HYPERTENSION, BENIGN 11/11/2008   KNEE PAIN, LEFT 11/11/2008    History reviewed. No pertinent surgical history.     Home Medications    Prior to Admission medications   Medication Sig Start Date End Date Taking? Authorizing Provider  aspirin EC 81 MG tablet Take 81 mg by mouth daily. 08/28/22 08/29/23  [provider]  CIALIS 20 MG tablet Take 20 mg by mouth daily as needed. 08/28/22 08/29/23  [provider]  clotrimazole (LOTRIMIN) 1 % cream Apply 1 Application topically 2 (two) times daily. 08/29/22 08/30/23  [provider]  ibuprofen (ADVIL) 600 MG tablet Take 1 tablet (600 mg total) by mouth every 6 (six) hours as needed. 06/06/21   Melynda Ripple, MD  ketoconazole (NIZORAL) 2 % shampoo Apply 1 Application topically daily. 08/29/22 08/30/23  [provider]  lisinopril (PRINIVIL,ZESTRIL) 10 MG tablet Take 10 mg by mouth daily.    [provider]  omeprazole (PRILOSEC) 20 MG capsule Take 20 mg by mouth daily.    [provider]  oseltamivir (TAMIFLU) 75 MG capsule Take 1 capsule (75 mg total) by mouth every 12 (twelve) hours. 12/12/22    Raylene Everts, MD  simvastatin (ZOCOR) 10 MG tablet Take 10 mg by mouth daily at 6 PM. 08/28/22 08/29/23  [provider]    Family History Family History  Problem Relation Age of Onset   Diabetes Mother    Hypertension Mother    Heart attack Father    Heart attack Paternal Grandfather     Social History Social History   Tobacco Use   Smoking status: Never    Passive exposure: Past   Smokeless tobacco: Never  Substance Use Topics   Alcohol use: No   Drug use: No     Allergies   Sildenafil and Sildenafil citrate   Review of Systems Review of Systems See HPI  Physical Exam Triage Vital Signs ED Triage Vitals  Enc Vitals Group     BP 12/12/22 0827 (!) 135/92     Pulse Rate 12/12/22 0827 64     Resp 12/12/22 0827 18     Temp 12/12/22 0827 98.3 F (36.8 C)     Temp Source 12/12/22 0827 Oral     SpO2 12/12/22 0827 96 %     Weight --      Height --      Head Circumference --      Peak Flow --      Pain Score 12/12/22 0826 0     Pain Loc --  Pain Edu? --      Excl. in [place]? --    No data found.  Updated Vital Signs BP (!) 135/92 (BP Location: Left Arm)   Pulse 64   Temp 98.3 F (36.8 C) (Oral)   Resp 18   SpO2 96%       Physical Exam Constitutional:      General: He is not in acute distress.    Appearance: He is well-developed. He is ill-appearing.  HENT:     Head: Normocephalic and atraumatic.     Right Ear: Tympanic membrane and ear canal normal.     Left Ear: Tympanic membrane and ear canal normal.     Nose: Congestion present.     Mouth/Throat:     Pharynx: Posterior oropharyngeal erythema present.  Eyes:     Conjunctiva/sclera: Conjunctivae normal.     Pupils: Pupils are equal, round, and reactive to light.  Cardiovascular:     Rate and Rhythm: Normal rate and regular rhythm.     Heart sounds: Normal heart sounds.  Pulmonary:     Effort: Pulmonary effort is normal. No respiratory distress.     Breath sounds: Normal  breath sounds.  Abdominal:     General: There is no distension.     Palpations: Abdomen is soft.  Musculoskeletal:        General: Normal range of motion.     Cervical back: Normal range of motion.  Skin:    General: Skin is warm and dry.  Neurological:     Mental Status: He is alert.      UC Treatments / Results  Labs (all labs ordered are listed, but only abnormal results are displayed) Labs Reviewed  POCT INFLUENZA A/B - Abnormal; Notable for the following components:      Result Value   Influenza A, POC Positive (*)    All other components within normal limits  POC SARS CORONAVIRUS 2 AG -  ED    EKG   Radiology No results found.  Procedures Procedures (including critical care time)  Medications Ordered in UC Medications - No data to display  Initial Impression / Assessment and Plan / UC Course  I have reviewed the triage vital signs and the nursing notes.  Pertinent labs & imaging results that were available during my care of the patient were reviewed by me and considered in my medical decision making (see chart for details).     Discussed influenza.  He is out of the window for Tamiflu, however, he feels so bad he was hoping I would "try it anyway".  Discussed home care.  Return to work.  Prevention and family Final Clinical Impressions(s) / UC Diagnoses   Final diagnoses:  Influenza     Discharge Instructions      Take Tamiflu 2 times a day for 5 days Drink lots of water May take over-the-counter cough and cold medicines as needed Take ibuprofen or Tylenol for any pain and fever Call for problems  (Dr. Madilyn Fireman called in Tamiflu for your wife to take to prevent catching flu)    ED Prescriptions     Medication Sig Dispense Auth. Provider   oseltamivir (TAMIFLU) 75 MG capsule Take 1 capsule (75 mg total) by mouth every 12 (twelve) hours. 10 capsule Raylene Everts, MD      PDMP not reviewed this encounter.   Raylene Everts,  MD 12/12/22 [phone]

## 2022-12-12 NOTE — ED Triage Notes (Signed)
Patient presents to UC for cough, fever, and chills since Thursday. States worse at night. Taking OTC sinus med, theraflu, and tylenol.

## 2022-12-13 ENCOUNTER — Telehealth: Payer: Self-pay | Admitting: Emergency Medicine

## 2022-12-13 NOTE — Telephone Encounter (Signed)
Spoke with patient states that he's feeling a little better, resting and taken his Tamiflu.  He will continue to rest and hydrate.  Follow up as needed.

## 2022-12-29 ENCOUNTER — Other Ambulatory Visit
Admission: RE | Admit: 2022-12-29 | Discharge: 2022-12-29 | Disposition: A | Discharge status: Home / Self Care | Payer: Medicare Other | Source: Ambulatory Visit | Attending: Cardiology | Admitting: Cardiology

## 2022-12-29 ENCOUNTER — Ambulatory Visit: Payer: No Typology Code available for payment source | Attending: Cardiology

## 2022-12-29 DIAGNOSIS — E782 Mixed hyperlipidemia: Secondary | ICD-10-CM | POA: Diagnosis present

## 2022-12-29 DIAGNOSIS — R9431 Abnormal electrocardiogram [ECG] [EKG]: Secondary | ICD-10-CM

## 2022-12-29 LAB — ECHOCARDIOGRAM COMPLETE
AR max vel: 3.84 cm2
AV Area VTI: 4.22 cm2
AV Area mean vel: 3.95 cm2
AV Mean grad: 2 mmHg
AV Peak grad: 3.3 mmHg
Ao pk vel: 0.91 m/s
Area-P 1/2: 3.53 cm2
Calc EF: 52.3 %
S' Lateral: 3.1 cm
Single Plane A2C EF: 52.9 %
Single Plane A4C EF: 52 %

## 2022-12-29 LAB — LIPID PANEL
Cholesterol: 148 mg/dL (ref 0–200)
HDL: 35 mg/dL — ABNORMAL LOW (ref >40)
LDL Cholesterol: 96 mg/dL (ref 0–99)
Total CHOL/HDL Ratio: 4.2 RATIO
Triglycerides: 84 mg/dL (ref <150)
VLDL: 17 mg/dL (ref 0–40)

## 2023-01-02 ENCOUNTER — Ambulatory Visit: Payer: Medicare Other | Admitting: Cardiology

## 2023-01-08 ENCOUNTER — Ambulatory Visit: Payer: Medicare Other | Admitting: Cardiology

## 2023-02-14 ENCOUNTER — Ambulatory Visit: Payer: Medicare Other | Admitting: Cardiology

## 2023-02-22 NOTE — Progress Notes (Signed)
Cardiology Office Note:   Date:  02/23/2023  ID:  Ricky Barrett, DOB 05-04-1966, MRN [id number]  History of Present Illness:   Ricky Barrett is a 57 y.o. male past medical history of hypertension, hyperlipidemia, and abnormal EKG, who is here today for follow-up.  He was last seen in clinic 11/24/2022 and seen by Dr.Agbor-Etang.  [facility].  An echocardiogram was obtained by his PCP during his last visit that was noted to be abnormal.  He denied any chest pain or shortness of breath.  He endorsed having right lower extremity edema, and varicose veins.  He says his legs usually swell bilaterally has been on his feet for long period of time but his left leg was okay.  He was recently diagnosed with hyperlipidemia and was started on simvastatin approximately 2 months prior.  He was scheduled for an echocardiogram and a lipid panel.  He returns to clinic today stating that overall he has been doing well. Denies any chest pain or shortness of breath. Continues to have concerns over right lower extremity swelling. Stated that he was to  previously have an ultrasound ordered but never had the study completed.  His medications without side effects.  Denies any hospitalizations or visits to the emergency department.  Does have complaint of possible eczema to the right lower extremity plantar aspect of his ankle as well.  ROS: Review of systems been completed is considered negative with exception of what is listed in the HPI  Studies Reviewed:    EKG:  sinus rate of 65, TWI in II,III, V4-V6, unchanged from previous EKG from 2014  TTE 12/29/22 1. Left ventricular ejection fraction, by estimation, is 55 to 60%. The  left ventricle has normal function. The left ventricle has no regional  wall motion abnormalities. There is mild to moderate left ventricular  hypertrophy. Left ventricular diastolic  parameters are consistent with Grade II diastolic  dysfunction  (pseudonormalization).   2. Right ventricular systolic function is normal. The right ventricular  size is mildly enlarged. There is normal pulmonary artery systolic  pressure. The estimated right ventricular systolic pressure is 25.1 mmHg.   3. Left atrial size was mildly dilated.   4. The mitral valve is normal in structure. Mild to moderate mitral valve  regurgitation. No evidence of mitral stenosis.   5. The aortic valve has an indeterminant number of cusps. Aortic valve  regurgitation is not visualized. No aortic stenosis is present.   6. The inferior vena cava is normal in size with greater than 50%  respiratory variability, suggesting right atrial pressure of 3 mmHg.    Risk Assessment/Calculations:              Physical Exam:   VS:  BP 132/84 (BP Location: Left Arm, Patient Position: Sitting, Cuff Size: Normal)   Pulse 65   Ht  (1.854 m)   Wt 252 lb 6.4 oz (114.5 kg)   SpO2 95%   BMI 33.30 kg/m    Wt Readings from Last 3 Encounters:  02/23/23 252 lb 6.4 oz (114.5 kg)  11/24/22 250 lb 3.2 oz (113.5 kg)  06/06/14 246 lb 8 oz (111.8 kg)     GEN: Well nourished, well developed in no acute distress NECK: No JVD; No carotid bruits CARDIAC: RRR, no murmurs, rubs, gallops RESPIRATORY:  Clear to auscultation without rales, wheezing or rhonchi  ABDOMEN: Soft, non-tender, non-distended EXTREMITIES:  No edema; No deformity  ASSESSMENT AND PLAN:   Primary hypertension with a blood pressure of 126/90 recheck 132/84.  Blood pressure remained stable.  He has been continued on lisinopril 10 mg daily.  Has been encouraged to monitor his pressure at home as well.    Unilateral leg swelling primarily on the right.  He is complaining of some pain to the right lower extremity today.  Patient stated he was previously to have an ultrasound done of that leg but the test was not ordered.  He has been sent for an ultrasound for unilateral leg swelling with discomfort to rule  out DVT.  Hyperlipidemia with repeat LDL of 96.  He is continued on simvastatin 10 mg daily.  Mild to moderate MR noted on echocardiogram.  LVEF 55 to 60%, with no regional wall motion abnormalities, G2 DD.  He states that his brother also has the same leakage in the mitral valve as well.  He is asymptomatic.  Will continue to follow with surveillance echocardiograms.  Disposition patient return to clinic to see MD/APP in 6 months or sooner if needed.  Has been advised patient ultrasound results are abnormal his appointment may be changed and he may have to come back sooner.        Signed, Ricky Knipple, NP

## 2023-02-23 ENCOUNTER — Encounter: Payer: Self-pay | Admitting: *Deleted

## 2023-02-23 ENCOUNTER — Ambulatory Visit: Payer: No Typology Code available for payment source | Attending: Cardiology | Admitting: Cardiology

## 2023-02-23 ENCOUNTER — Encounter: Payer: Self-pay | Admitting: Cardiology

## 2023-02-23 VITALS — BP 132/84 | HR 65 | Ht 73.0 in | Wt 252.4 lb

## 2023-02-23 DIAGNOSIS — I340 Nonrheumatic mitral (valve) insufficiency: Secondary | ICD-10-CM

## 2023-02-23 DIAGNOSIS — R600 Localized edema: Secondary | ICD-10-CM

## 2023-02-23 DIAGNOSIS — I10 Essential (primary) hypertension: Secondary | ICD-10-CM

## 2023-02-23 DIAGNOSIS — I824Y1 Acute embolism and thrombosis of unspecified deep veins of right proximal lower extremity: Secondary | ICD-10-CM

## 2023-02-23 DIAGNOSIS — E782 Mixed hyperlipidemia: Secondary | ICD-10-CM | POA: Diagnosis not present

## 2023-02-23 NOTE — Patient Instructions (Signed)
Medication Instructions:   Your physician recommends that you continue on your current medications as directed. Please refer to the Current Medication list given to you today.  *If you need a refill on your cardiac medications before your next appointment, please call your pharmacy*   Lab Work:  No labs ordered today  If you have labs (blood work) drawn today and your tests are completely normal, you will receive your results only by: MyChart Message (if you have MyChart) OR A paper copy in the mail If you have any lab test that is abnormal or we need to change your treatment, we will call you to review the results.   Testing/Procedures:  Your physician has requested that you have a lower or upper extremity venous duplex. This test is an ultrasound of the veins in the legs or arms. It looks at venous blood flow that carries blood from the heart to the legs or arms. Allow one hour for a Lower Venous exam. Allow thirty minutes for an Upper Venous exam. There are no restrictions or special instructions.      Follow-Up: At [facility], you and your health needs are our priority.  As part of our continuing mission to provide you with exceptional heart care, we have created designated Provider Care Teams.  These Care Teams include your primary Cardiologist (physician) and Advanced Practice Providers (APPs -  Physician Assistants and Nurse Practitioners) who all work together to provide you with the care you need, when you need it.  We recommend signing up for the patient portal called "MyChart".  Sign up information is provided on this After Visit Summary.  MyChart is used to connect with patients for Virtual Visits (Telemedicine).  Patients are able to view lab/test results, encounter notes, upcoming appointments, etc.  Non-urgent messages can be sent to your provider as well.   To learn more about what you can do with MyChart, go to [email or web address].    Your next  appointment:   6 month(s)  Provider:   You may see Debbe Odea, MD or one of the following Advanced Practice Providers on your designated Care Team:   Nicolasa Ducking, NP Eula Listen, PA-C Cadence Fransico Michael, PA-C Charlsie Quest, NP

## 2023-02-28 ENCOUNTER — Ambulatory Visit
Admission: EM | Admit: 2023-02-28 | Discharge: 2023-02-28 | Disposition: A | Discharge status: Home / Self Care | Payer: Medicare Other | Attending: Urgent Care | Admitting: Urgent Care

## 2023-02-28 DIAGNOSIS — R21 Rash and other nonspecific skin eruption: Secondary | ICD-10-CM

## 2023-02-28 MED ORDER — CLOTRIMAZOLE-BETAMETHASONE 1-0.05 % EX CREA: TOPICAL_CREAM | CUTANEOUS | 0 refills | Status: AC

## 2023-02-28 MED ORDER — NYSTATIN 100000 UNIT/GM EX POWD: 1.0000 | Freq: Three times a day (TID) | CUTANEOUS | 0 refills | Status: AC

## 2023-02-28 NOTE — Discharge Instructions (Addendum)
Twice daily,  Wash area with soap and water. Blot dry.  Allow to air dry completely or use a blow dryer on the cool setting. Apply a small amount of antifungal-steroid cream.  Rub in completely.  Allow to air dry completely or use a blow dryer on the cool setting. Dust the affected area with nystatin powder.  Multiple times a day, apply medicated powder ([employer] or store brand) to ensure dryness.  Wear loose fitting cotton undergarments.

## 2023-02-28 NOTE — ED Triage Notes (Signed)
Itchy rash on groin that started 3 days ago. Tried lotrimin cream and benadryl but no relief of symptoms.

## 2023-02-28 NOTE — ED Provider Notes (Signed)
[name]    CSN: [id number] Arrival date & time: 02/28/23  1618      History   Chief Complaint Chief Complaint  Patient presents with   Rash    HPI SHERMAINE BRIGHAM is a 57 y.o. male.   HPI  Patient presents to urgent care with itchy rash on his groin starting 3 days ago.  Patient has been using Lotrimin cream and Benadryl without relief of symptoms.  Past Medical History:  Diagnosis Date   Hypertension     Patient Active Problem List   Diagnosis Date Noted   GERD (gastroesophageal reflux disease) 08/08/2013   ERECTILE DYSFUNCTION 01/07/2009   DYSLIPIDEMIA 11/11/2008   ESSENTIAL HYPERTENSION, BENIGN 11/11/2008   KNEE PAIN, LEFT 11/11/2008    History reviewed. No pertinent surgical history.     Home Medications    Prior to Admission medications   Medication Sig Start Date End Date Taking? Authorizing Provider  aspirin EC 81 MG tablet Take 81 mg by mouth daily. 08/28/22 08/29/23 Yes [provider]  CIALIS 20 MG tablet Take 20 mg by mouth daily as needed. 08/28/22 08/29/23 Yes [provider]  clotrimazole (LOTRIMIN) 1 % cream Apply 1 Application topically 2 (two) times daily. 08/29/22 08/30/23 Yes [provider]  ibuprofen (ADVIL) 600 MG tablet Take 1 tablet (600 mg total) by mouth every 6 (six) hours as needed. 06/06/21  Yes Domenick Gong, MD  ketoconazole (NIZORAL) 2 % shampoo Apply 1 Application topically daily. 08/29/22 08/30/23 Yes [provider]  lisinopril (PRINIVIL,ZESTRIL) 10 MG tablet Take 10 mg by mouth daily.   Yes [provider]  omeprazole (PRILOSEC) 20 MG capsule Take 20 mg by mouth daily.   Yes [provider]  simvastatin (ZOCOR) 10 MG tablet Take 10 mg by mouth daily at 6 PM. 08/28/22 08/29/23 Yes [provider]  oseltamivir (TAMIFLU) 75 MG capsule Take 1 capsule (75 mg total) by mouth every 12 (twelve) hours. Patient not taking: Reported on 02/23/2023 12/12/22    Eustace Moore, MD    Family History Family History  Problem Relation Age of Onset   Diabetes Mother    Hypertension Mother    Heart attack Father    Heart attack Paternal Grandfather     Social History Social History   Tobacco Use   Smoking status: Never    Passive exposure: Past   Smokeless tobacco: Never  Substance Use Topics   Alcohol use: No   Drug use: No     Allergies   Sildenafil and Sildenafil citrate   Review of Systems Review of Systems   Physical Exam Triage Vital Signs ED Triage Vitals  Enc Vitals Group     BP 02/28/23 1639 (!) 144/93     Pulse Rate 02/28/23 1639 75     Resp 02/28/23 1639 16     Temp 02/28/23 1639 99.2 F (37.3 C)     Temp Source 02/28/23 1639 Oral     SpO2 02/28/23 1639 94 %     Weight --      Height --      Head Circumference --      Peak Flow --      Pain Score 02/28/23 1640 7     Pain Loc --      Pain Edu? --      Excl. in GC? --    No data found.  Updated Vital Signs BP (!) 144/93 (BP Location: Right Arm)   Pulse 75  Temp 99.2 F (37.3 C) (Oral)   Resp 16   SpO2 94%   Visual Acuity Right Eye Distance:   Left Eye Distance:   Bilateral Distance:    Right Eye Near:   Left Eye Near:    Bilateral Near:     Physical Exam   UC Treatments / Results  Labs (all labs ordered are listed, but only abnormal results are displayed) Labs Reviewed - No data to display  EKG   Radiology No results found.  Procedures Procedures (including critical care time)  Medications Ordered in UC Medications - No data to display  Initial Impression / Assessment and Plan / UC Course  I have reviewed the triage vital signs and the nursing notes.  Pertinent labs & imaging results that were available during my care of the patient were reviewed by me and considered in my medical decision making (see chart for details).   Likely fungal infection. Providing patient with treatment instructions.  Counseled patient on  potential for adverse effects with medications prescribed/recommended today, ER and return-to-clinic precautions discussed, patient verbalized understanding and agreement with care plan.     Final Clinical Impressions(s) / UC Diagnoses   Final diagnoses:  None   Discharge Instructions   None    ED Prescriptions   None    PDMP not reviewed this encounter.   Charma Igo, [place] 02/28/23 1658

## 2023-03-05 ENCOUNTER — Ambulatory Visit
Admission: EM | Admit: 2023-03-05 | Discharge: 2023-03-05 | Disposition: A | Discharge status: Home / Self Care | Payer: Medicare Other | Attending: Urgent Care | Admitting: Urgent Care

## 2023-03-05 DIAGNOSIS — B356 Tinea cruris: Secondary | ICD-10-CM

## 2023-03-05 MED ORDER — ITRACONAZOLE 100 MG PO CAPS: 100.0000 mg | ORAL_CAPSULE | Freq: Every day | ORAL | 0 refills | Status: AC

## 2023-03-05 NOTE — ED Provider Notes (Signed)
Ricky Barrett    CSN: [id number] Arrival date & time: 03/05/23  0820      History   Chief Complaint Chief Complaint  Patient presents with   Rash    HPI Ricky Barrett is a 57 y.o. male.   HPI  Patient presents to urgent care for follow-up after 02/28/2023 visit when he was seen for rash in his groin.  At that time he reported using Lotrimin cream and Benadryl without relief.  Past Medical History:  Diagnosis Date   Hypertension     Patient Active Problem List   Diagnosis Date Noted   GERD (gastroesophageal reflux disease) 08/08/2013   ERECTILE DYSFUNCTION 01/07/2009   DYSLIPIDEMIA 11/11/2008   ESSENTIAL HYPERTENSION, BENIGN 11/11/2008   KNEE PAIN, LEFT 11/11/2008    History reviewed. No pertinent surgical history.     Home Medications    Prior to Admission medications   Medication Sig Start Date End Date Taking? Authorizing Provider  aspirin EC 81 MG tablet Take 81 mg by mouth daily. 08/28/22 08/29/23  [provider]  CIALIS 20 MG tablet Take 20 mg by mouth daily as needed. 08/28/22 08/29/23  [provider]  clotrimazole (LOTRIMIN) 1 % cream Apply 1 Application topically 2 (two) times daily. 08/29/22 08/30/23  [provider]  clotrimazole-betamethasone (LOTRISONE) cream Apply to affected area 2 times daily x 7 days. Allow to dry completely after application. 02/28/23   Janina Trafton, Jeannett Senior, FNP  ibuprofen (ADVIL) 600 MG tablet Take 1 tablet (600 mg total) by mouth every 6 (six) hours as needed. 06/06/21   Domenick Gong, MD  ketoconazole (NIZORAL) 2 % shampoo Apply 1 Application topically daily. 08/29/22 08/30/23  [provider]  lisinopril (PRINIVIL,ZESTRIL) 10 MG tablet Take 10 mg by mouth daily.    [provider]  nystatin (MYCOSTATIN/NYSTOP) powder Apply 1 Application topically 3 (three) times daily. 02/28/23   Verland Sprinkle, Jeannett Senior, FNP  omeprazole (PRILOSEC) 20 MG capsule Take 20 mg by mouth daily.     [provider]  oseltamivir (TAMIFLU) 75 MG capsule Take 1 capsule (75 mg total) by mouth every 12 (twelve) hours. Patient not taking: Reported on 02/23/2023 12/12/22   Eustace Moore, MD  simvastatin (ZOCOR) 10 MG tablet Take 10 mg by mouth daily at 6 PM. 08/28/22 08/29/23  [provider]    Family History Family History  Problem Relation Age of Onset   Diabetes Mother    Hypertension Mother    Heart attack Father    Heart attack Paternal Grandfather     Social History Social History   Tobacco Use   Smoking status: Never    Passive exposure: Past   Smokeless tobacco: Never  Substance Use Topics   Alcohol use: No   Drug use: No     Allergies   Sildenafil and Sildenafil citrate   Review of Systems Review of Systems   Physical Exam Triage Vital Signs ED Triage Vitals  Enc Vitals Group     BP 03/05/23 0906 (!) 161/98     Pulse Rate 03/05/23 0906 66     Resp 03/05/23 0906 18     Temp 03/05/23 0906 97.8 F (36.6 C)     Temp Source 03/05/23 0906 Temporal     SpO2 03/05/23 0906 94 %     Weight --      Height --      Head Circumference --      Peak Flow --      Pain Score  03/05/23 0905 0     Pain Loc --      Pain Edu? --      Excl. in GC? --    No data found.  Updated Vital Signs BP (!) 161/98 (BP Location: Left Arm)   Pulse 66   Temp 97.8 F (36.6 C) (Temporal)   Resp 18   SpO2 94%   Visual Acuity Right Eye Distance:   Left Eye Distance:   Bilateral Distance:    Right Eye Near:   Left Eye Near:    Bilateral Near:     Physical Exam   UC Treatments / Results  Labs (all labs ordered are listed, but only abnormal results are displayed) Labs Reviewed - No data to display  EKG   Radiology No results found.  Procedures Procedures (including critical care time)  Medications Ordered in UC Medications - No data to display  Initial Impression / Assessment and Plan / UC Course  I have reviewed the triage vital signs  and the nursing notes.  Pertinent labs & imaging results that were available during my care of the patient were reviewed by me and considered in my medical decision making (see chart for details).   Ricky Barrett is a 57 y.o. male presenting with scrotal redness. Patient is afebrile without recent antipyretics, satting well on room air. Overall is well appearing and non-toxic, well hydrated, without respiratory distress. Bright redness in scrotum bilaterally  Unclear if initial diagnosis of jock itch is accurate given the lack of response to clotrimazole.  Scrotal redness and tenderness is noted today and is suspicious for "red scrotal syndrome" rather than fungal infection.  Patient denies ended use of topical steroids and did not use the clotrimazole-betamethasone that was prescribed at the last visit, nor hydrocortisone cream.  Will prescribe itraconazole today to rule out tinea cruris. Interaction between itraconazole and simvastatin was noted and patient asked to hold simvastatin for 1 week while completing this treatment.  In addition, interaction between itraconazole and PPI omeprazole was noted however reluctant to ask him to hold PPI given symptoms are so well controlled.  Patient is advised to seek evaluation and treatment at dermatology if treatment prescribed today is ineffective.  Counseled patient on potential for adverse effects with medications prescribed/recommended today, ER and return-to-clinic precautions discussed, patient verbalized understanding and agreement with care plan.   Final Clinical Impressions(s) / UC Diagnoses   Final diagnoses:  None   Discharge Instructions   None    ED Prescriptions   None    PDMP not reviewed this encounter.   Charma Igo, [place] 03/05/23 [phone]

## 2023-03-05 NOTE — Discharge Instructions (Signed)
Follow-up with an evaluation by dermatology if your symptoms are not improving after 1 week with antifungal treatment.

## 2023-03-05 NOTE — ED Triage Notes (Signed)
Patient presents to [facility] for follow-up from 04/17 visit. He states he was seen for testicular rash that is not getting better. Given steroids and nystatin that is not helping.

## 2023-03-27 ENCOUNTER — Ambulatory Visit: Payer: Medicare Other

## 2023-04-04 ENCOUNTER — Ambulatory Visit
Admission: EM | Admit: 2023-04-04 | Discharge: 2023-04-04 | Disposition: A | Discharge status: Home / Self Care | Payer: No Typology Code available for payment source | Attending: Emergency Medicine | Admitting: Emergency Medicine

## 2023-04-04 DIAGNOSIS — J0100 Acute maxillary sinusitis, unspecified: Secondary | ICD-10-CM | POA: Diagnosis not present

## 2023-04-04 MED ORDER — AMOXICILLIN 875 MG PO TABS: 875.0000 mg | ORAL_TABLET | Freq: Two times a day (BID) | ORAL | 0 refills | Status: AC

## 2023-04-04 NOTE — Discharge Instructions (Signed)
Take the amoxicillin as directed.  Follow up with your primary care provider if your symptoms are not improving.   ° ° °

## 2023-04-04 NOTE — ED Provider Notes (Signed)
[name]    CSN: [id number] Arrival date & time: 04/04/23  1815      History   Chief Complaint Chief Complaint  Patient presents with   Facial Pain    HPI Ricky Barrett is a 57 y.o. male.  Patient presents with 2-week history of sinus pressure, congestion, postnasal drip, runny nose, cough.  He reports occasional nosebleed recently.  At the onset of his symptoms he had a low-grade fever but none since.  He has been taking ibuprofen and OTC cold medication.  He denies rash, chest pain, shortness of breath, vomiting, diarrhea, or other symptoms.    The history is provided by the patient and medical records.    Past Medical History:  Diagnosis Date   Hypertension     Patient Active Problem List   Diagnosis Date Noted   GERD (gastroesophageal reflux disease) 08/08/2013   ERECTILE DYSFUNCTION 01/07/2009   DYSLIPIDEMIA 11/11/2008   ESSENTIAL HYPERTENSION, BENIGN 11/11/2008   KNEE PAIN, LEFT 11/11/2008    History reviewed. No pertinent surgical history.     Home Medications    Prior to Admission medications   Medication Sig Start Date End Date Taking? Authorizing Provider  amoxicillin (AMOXIL) 875 MG tablet Take 1 tablet (875 mg total) by mouth 2 (two) times daily for 10 days. 04/04/23 04/14/23 Yes Mickie Bail, NP  aspirin EC 81 MG tablet Take 81 mg by mouth daily. 08/28/22 08/29/23 Yes [provider]  CIALIS 20 MG tablet Take 20 mg by mouth daily as needed. 08/28/22 08/29/23 Yes [provider]  clotrimazole (LOTRIMIN) 1 % cream Apply 1 Application topically 2 (two) times daily. 08/29/22 08/30/23 Yes [provider]  clotrimazole-betamethasone (LOTRISONE) cream Apply to affected area 2 times daily x 7 days. Allow to dry completely after application. 02/28/23  Yes Immordino, Jeannett Senior, FNP  ibuprofen (ADVIL) 600 MG tablet Take 1 tablet (600 mg total) by mouth every 6 (six) hours as needed. 06/06/21  Yes Domenick Gong, MD   ketoconazole (NIZORAL) 2 % shampoo Apply 1 Application topically daily. 08/29/22 08/30/23 Yes [provider]  lisinopril (PRINIVIL,ZESTRIL) 10 MG tablet Take 10 mg by mouth daily.   Yes [provider]  omeprazole (PRILOSEC) 20 MG capsule Take 20 mg by mouth daily.   Yes [provider]  simvastatin (ZOCOR) 10 MG tablet Take 10 mg by mouth daily at 6 PM. 08/28/22 08/29/23 Yes [provider]  nystatin (MYCOSTATIN/NYSTOP) powder Apply 1 Application topically 3 (three) times daily. 02/28/23   Immordino, Jeannett Senior, FNP  oseltamivir (TAMIFLU) 75 MG capsule Take 1 capsule (75 mg total) by mouth every 12 (twelve) hours. Patient not taking: Reported on 02/23/2023 12/12/22   Eustace Moore, MD    Family History Family History  Problem Relation Age of Onset   Diabetes Mother    Hypertension Mother    Heart attack Father    Heart attack Paternal Grandfather     Social History Social History   Tobacco Use   Smoking status: Never    Passive exposure: Past   Smokeless tobacco: Never  Substance Use Topics   Alcohol use: No   Drug use: No     Allergies   Sildenafil and Sildenafil citrate   Review of Systems Review of Systems  Constitutional:  Negative for chills and fever.  HENT:  Positive for congestion, postnasal drip, rhinorrhea, sinus pressure and sore throat.   Respiratory:  Positive for cough. Negative for shortness of breath.   Cardiovascular:  Negative for chest pain and palpitations.  Gastrointestinal:  Negative for diarrhea and vomiting.     Physical Exam Triage Vital Signs ED Triage Vitals  Enc Vitals Group     BP 04/04/23 1859 127/87     Pulse Rate 04/04/23 1848 85     Resp 04/04/23 1848 18     Temp 04/04/23 1848 98.5 F (36.9 C)     Temp src --      SpO2 04/04/23 1848 96 %     Weight --      Height --      Head Circumference --      Peak Flow --      Pain Score 04/04/23 1859 7     Pain Loc --      Pain Edu? --       Excl. in GC? --    No data found.  Updated Vital Signs BP 127/87 (BP Location: Left Arm)   Pulse 85   Temp 98.5 F (36.9 C)   Resp 18   SpO2 96%   Visual Acuity Right Eye Distance:   Left Eye Distance:   Bilateral Distance:    Right Eye Near:   Left Eye Near:    Bilateral Near:     Physical Exam Vitals and nursing note reviewed.  Constitutional:      General: He is not in acute distress.    Appearance: Normal appearance. He is well-developed. He is not ill-appearing.  HENT:     Right Ear: Tympanic membrane normal.     Left Ear: Tympanic membrane normal.     Nose: Congestion present.     Mouth/Throat:     Mouth: Mucous membranes are moist.     Pharynx: Oropharynx is clear.  Cardiovascular:     Rate and Rhythm: Normal rate and regular rhythm.     Heart sounds: Normal heart sounds.  Pulmonary:     Effort: Pulmonary effort is normal. No respiratory distress.     Breath sounds: Normal breath sounds.  Musculoskeletal:     Cervical back: Neck supple.  Skin:    General: Skin is warm and dry.  Neurological:     Mental Status: He is alert.  Psychiatric:        Mood and Affect: Mood normal.        Behavior: Behavior normal.      UC Treatments / Results  Labs (all labs ordered are listed, but only abnormal results are displayed) Labs Reviewed - No data to display  EKG   Radiology No results found.  Procedures Procedures (including critical care time)  Medications Ordered in UC Medications - No data to display  Initial Impression / Assessment and Plan / UC Course  I have reviewed the triage vital signs and the nursing notes.  Pertinent labs & imaging results that were available during my care of the patient were reviewed by me and considered in my medical decision making (see chart for details).   Acute sinusitis.  Patient has been symptomatic for 2 weeks and is not improving with OTC treatment.  Treating today with amoxicillin.  Tylenol or ibuprofen as  needed.  Instructed patient to follow up with his PCP if his symptoms are not improving.  He agrees to plan of care.     Final Clinical Impressions(s) / UC Diagnoses   Final diagnoses:  Acute non-recurrent maxillary sinusitis     Discharge Instructions      Take the amoxicillin as directed.  Follow  up with your primary care provider if your symptoms are not improving.        ED Prescriptions     Medication Sig Dispense Auth. Provider   amoxicillin (AMOXIL) 875 MG tablet Take 1 tablet (875 mg total) by mouth 2 (two) times daily for 10 days. 20 tablet Mickie Bail, NP      PDMP not reviewed this encounter.   Mickie Bail, NP 04/04/23 [phone]

## 2023-04-04 NOTE — ED Triage Notes (Signed)
Chills, sinus pain and pressure, small nose bleeds, that started 03/25/23. Taking OTC sinus medication, ibuprofen with no relief of symptoms.

## 2023-04-13 ENCOUNTER — Ambulatory Visit: Payer: Medicare Other | Attending: Cardiology

## 2023-04-13 DIAGNOSIS — R600 Localized edema: Secondary | ICD-10-CM | POA: Diagnosis present

## 2023-04-13 DIAGNOSIS — I824Y1 Acute embolism and thrombosis of unspecified deep veins of right proximal lower extremity: Secondary | ICD-10-CM | POA: Insufficient documentation

## 2023-04-16 NOTE — Progress Notes (Signed)
No evidence of a blood clot in the right lower extremity.

## 2023-12-03 ENCOUNTER — Ambulatory Visit: Payer: No Typology Code available for payment source | Attending: Internal Medicine | Admitting: Occupational Therapy

## 2023-12-03 ENCOUNTER — Encounter: Payer: Self-pay | Admitting: Occupational Therapy

## 2023-12-03 DIAGNOSIS — I890 Lymphedema, not elsewhere classified: Secondary | ICD-10-CM | POA: Diagnosis present

## 2023-12-03 NOTE — Therapy (Unsigned)
OUTPATIENT OCCUPATIONAL THERAPY EVALUATION RLE/RLQ LOWER EXTREMITY LYMPHEDEMA   Patient Name: Ricky Barrett MRN: [id number] DOB:[birth year], 58 y.o., male Today's Date: 12/05/2023  END OF SESSION:       Past Medical History:  Diagnosis Date   Hypertension    History reviewed. No pertinent surgical history. Patient Active Problem List   Diagnosis Date Noted   GERD (gastroesophageal reflux disease) 08/08/2013   ERECTILE DYSFUNCTION 01/07/2009   DYSLIPIDEMIA 11/11/2008   ESSENTIAL HYPERTENSION, BENIGN 11/11/2008   KNEE PAIN, LEFT 11/11/2008    PCP: Dorene Grebe, MD  REFERRING PROVIDER: Teddy Spike, DO  REFERRING DIAG: I89.0  THERAPY DIAG:  Lymphedema, not elsewhere classified  Rationale for Evaluation and Treatment: Rehabilitation  ONSET DATE: insidious onset RLE swelling ~ 5 years ago  SUBJECTIVE:                                                                                                                                                                                           SUBJECTIVE STATEMENT: Ricky Barrett is referred to Occupational Therapy by Teddy Spike, DO, for evaluation of chronic, RLE lymphedema. Ricky Barrett is accompanied by his supportive spouse. Pt reports swelling started in his R leg about 5 years ago without known injury or illness. It has gotten worse over time and now, although it continues to fluctuate downward over night, it never fully resolves. Today Ricky Barrett presents  to lymphedema clinic wearing a knee length wrap appearing to be a thin Elveria Rising on the R foot and leg. He reports he has a non-healing wound on his R foot and the dermatologist put this wrap on several days ago. Pt states he has an upcoming podiatry consult in a few weeks. He has a history of painful bone spurs and plantar fascitis. Pt has not undergone lymphedema treatment before. RLE DVT and tendon obstruction ruled out 04/13/23.  PERTINENT HISTORY: Pls see PMH above.     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 10/24 Xray's reveal small plantar and Achilles calcaneal spurs, mild to moderate tibiotalar degenerative changes, mild degenerative changes elsewhere, mild soft tissue swelling.  Pt has upcoming surgical consult and podiatry consult.  PAIN:  Are you having pain? Yes: NPRS scale: 8/10 Pain location: "bottom of my R foot" Pain description: sore Aggravating factors: standing, walking, weight bearing Relieving factors: elevation, compression  PRECAUTIONS: Other: lymphedema  WEIGHT BEARING RESTRICTIONS: No  FALLS:  Has patient fallen in last 6 months? No  LIVING ENVIRONMENT: Lives with: lives with their family Lives in: House/apartment Stairs: Yes;  Has following equipment at home: Single point cane  OCCUPATION: retired Hotel manager; part time maintenance man  LEISURE: working out  HAND DOMINANCE: right   PRIOR LEVEL OF FUNCTION: Independent with basic ADLs, Independent with household mobility without device, Independent with community mobility without device, Independent with homemaking with ambulation, and Independent with gait  PATIENT GOALS: reduce pain and swelling in R foot and leg   OBJECTIVE: Note: Objective measures were completed at Evaluation unless otherwise noted.  MUSCLE STRENGTH: grossly WNL  RANGE OF MOTION: grossly WNL  COGNITION:  Overall cognitive status: Within functional limits for tasks assessed   OBSERVATIONS / OTHER ASSESSMENTS:  Unable to grade and stage RLE lymphedema because Pt arrives wearing  knee length Unna boot and we are not able to examine limb. We do not provide wound care and do not have wound care supplies to reapply after assessment. Based on chart review I anticipate at least stage II lymphedema with associated permanent skin changes 2/2 CVI.  BLE COMPARATIVE LIMB VOLUMETRICS: TBA visit 1. By visual  assessment R LEG appears to be ! 15-20% larger than L. Pt reminds me that R leg volume has always been greater because of premorbid condition that caused Pt to lose significant muscle mass on the left.  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  ? x x      Skin dry Flaky WNL Macerated   Mild- only able to examine L side ?  ?   Color Redness Varicosities Blanching Hemosiderin Stain Mottled   ?  + L; unable to examine R ?  ? ?   Odor Malodorous Yeast Fungal infection  WNL      x   Temperature Warm Cool wnl    L     Pitting Edema   1+ 2+ 3+ 4+ Non-pitting         X On L   Girth Symmetrical Asymmetrical                   Distribution    R>L     Stemmer Sign Positive Negative   - on L Unable to assess R    Lymphorrhea History Of:  Present Absent     ?    Wounds History Of Present Absent Venous Arterial Pressure Sheer    X Unable to assess 2/2 Unna Boot in place  ? ?      Signs of Infection Redness Warmth Erythema Acute Swelling Drainage Borders                    Sensation Light Touch Deep pressure Hypersensitivity   In tact Impaired In tact Impaired Absent Impaired   ?         Nails WNL   Fungus nail dystrophy     ?   Hair Growth Symmetrical  Asymmetrical       Skin Creases Base of toes  Ankles   Base of Fingers knees       Abdominal pannus Thigh Lobules  Face/neck              POSTURE: WFL.  GAIT: antalgic  gait   LE ROM: grossly WNL. Pt able to reach feet  LE MMT: R>L 2/2 muscle mass  SURGERY TYPE/DATE: N/A- non cancer related, secondary to CVI  HX INFECTIONS: unclear from Pt report  HX WOUNDS: + currently, non-healing  LYMPHEDEMA ASSESSMENTS:  Unable to fully assess stage and grade of lymphedema as R leg and foot are wrapped with Henriette Combs,. Because the lymphedema clinic does not treat patients with open wounds, we do not have wound dressings or, or other supplies needed to reapply dressings after evaluation. Re-useable Lymphedema compression wraps are not issued until the first CDT treatment visit.  LYMPHEDEMA LIFE IMPACT SCALE (LLIS): TBA  FOTO outcome measure: TBA  TREATMENT THIS DATE: Occupational Therapy Evaluation for BLE lymphedema Pt and family edu    PATIENT EDUCATION:  Discussed differential diagnoses for various swelling disorders. Provided basic level education regarding lymphatic structure and function, etiology, onset patterns, stages of progression, and prevention to limit infection risk, worsening condition and further functional decline. Pt edu for aught interaction between blood circulatory system and lymphatic circulation.Discussed  impact of gravity and co-morbidities on lymphatic function. Outlined Complete Decongestive Therapy (CDT)  as standard of care and provided in depth information regarding 4 primary components of Intensive and Self Management Phases, including Manual Lymph Drainage (MLD), compression wrapping and garments, skin care, and therapeutic exercise. Homero Fellers discussion with re need for frequent attendance and high burden of care when caregiver is needed, impact of co morbidities. We discussed  the chronic, progressive nature of lymphedema and Importance of daily, ongoing LE self-care essential for limiting progression and infection risk.  Person educated: Patient and spouse Education method: Explanation, Demonstration, verbal and tactile cues, and  Handouts Education comprehension: verbalized understanding, returned demonstration, verbal and tactile cues required, and needs further education  HOME EXERCISE PROGRAM: BLE lymphatic pumping there ex- 2 set of 10 each element, in order. Hold 5 2. Daily compression- R knee length, short stretch, multilayer compression bandages over single layer of Rosidal Soft foam and stockinett during Intensive Phase CDT; During self-Management Phase appropriate knee high compression stockings and Bilateral HOS devices  3. Daily skin care with low ph lotion matching skin ph 4. Daily simple self MLD     ASSESSMENT:   CLINICAL IMPRESSION: Ricky Uziel Caplette. 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 LE is a chronic , progressive condition with no cure. The edema fluctuates during hours of sleep, but never fully resolves. I am not able to examine the R leg today with [employer] in place, but because fluid no longer resolves with elevation or during HOS I am inclined to rate it as a class II case. Again, I am not able to examine directly, but I anticipate permanent skin changes, including thickening and decreased excursion due to fibrosis a result of long term lymphatic congestion.  Ongoing lymphedema progression limits functional performance in all occupational domains, including functional ambulation and mobility,  basic and instrumental ADLs,  including lower body dressing, LB bathing, fitting preferred street shoes and LB clothing; driving, shopping, and home management . Lymphedema and associated pain limits Pt's ability to perform productive activities and work duties, leisure pursuits and  limits social participation.  BLE lymphedema contributes to elevated infection risk and increased falls risk due to body asymmetry. Ricky. Manson Passey will benefit from skilled OT for Complete Decongestive Therapy (CDT) the gold standard of lymphedema care, including manual lymphatic drainage (MLD), skin care to  limit infection risk and increase skin excursion, lymphatic pumping exercise, and during the Intensive Phase multilayer, gradient compression bandaging to reduce limb volume. Once limb volume reduction reaches a clinical plateau custom compression garments that provide appropriate fit, compression and containment are fitted. Throughout the treatment course Pt will learn lymphedema prevention strategies and precautions, learn to perform all LE self-care home program components. Without skilled OT for lymphedema care, Pt's condition will progress and further functional decline is expected.  Pt understands that it is doubtful he will achieve the kind of pain control he seeks for orthopedic issues from CDT. Because he has  upcoming consults with podiatry and vascular surgery, Pt will not return for OT until those POC's are established to ensure that CDT is in keeping and to ensure we observe any recommended precautions. If CDT is contra-indicated, we will fit Pt with appropriate compression garments only and teach simple self-<ML, and other LE self care home program techniques.   Prognosis for optimal outcome is good, and better with caregiver assistance between visits for compression wrapping, regular attendance and > 85% compliance with, and habituation of all  LE self care home program components.   OBJECTIVE IMPAIRMENTS: Abnormal gait, decreased balance, decreased knowledge of condition, decreased knowledge of use of DME, impaired functional ambulation and mobility, decreased strength, impaired lymphatic function with typical skin and tissue changes, increased infection and wounds risk, impaired sensation, postural dysfunction, and pain.    ACTIVITY LIMITATIONS: impaired functional ambulation and mobility, difficulty walking, carrying, lifting, bending, sitting, standing, squatting, bed mobility. Impaired instrumental and basic ADLs, including LB bathing, LB dressing, and LB hygiene/grooming, fitting street  shoes, difficulty performing home management activities, difficulty performing exercise for leisure, only able to perform limited work duties;  limits social participation   PERSONAL FACTORS: Age, Behavior pattern, Past/current experiences, Time since onset of injury/illness/exacerbation, and 3+ co morbidities: varicose veins,  plantar fascitis, painful bone spurs, are also affecting patient's functional outcome.    REHAB POTENTIAL: Good   CLINICAL DECISION MAKING: Stable/uncomplicated   EVALUATION COMPLEXITY: Moderate  GOALS: Goals reviewed with patient? Yes   SHORT TERM GOALS: Target date: 4th OT Rx visit    Pt will demonstrate understanding of lymphedema precautions and prevention strategies with modified independence using a printed reference to identify at least 5 precautions and discussing how s/he may implement them into daily life to reduce risk of progression with modified assistance Baseline: max a Goal status: INITIAL   2.  With extra time (modified independence)  Pt will be able to apply multilayer, knee length, compression wraps using gradient techniques to decrease limb volume, to limit infection risk, and to limit lymphedema progression.   Baseline: Dependent Goal status: INITIAL     LONG TERM GOALS: Target date: 02/25/23 (12 WEEKS)     Given this patient's Intake score of tba % on the Lymphedema Life Impact Scale (LLIS), patient will experience a reduction of at least 5% in her perceived level of functional impairment resulting from lymphedema to improve functional performance and quality of life (QOL).Baseline: tbd Baseline: max a Goal status: INITIAL   2.  Given this patient's Intake score of TBA/100% on the functional outcomes FOTO tool, patient will experience an increase in function  of 5 points to improve basic and instrumental ADLs performance, including lymphedema self-care. (TBA at first OT Rx visit) Baseline: max a Goal status: INITIAL   3.  With modified  independence (extra time and assistive devices) Pt will be able to don and doff appropriate compression garments and/or devices to control BLE lymphedema and to limit progression.  Baseline: Dependent Goal status: INITIAL   4. Pt will achieve at least a 10% volume reductions to R LEG to return limb to more typical size and shape, to limit infection risk and LE progression, to decrease pain, to improve function, and to improve body image and QOL. Baseline: Dependent Goal status: INITIAL   5. Pt will achieve and sustain at least 85% attendance at OT sessions, and with compliance with all LE self-care home program components throughout CDT, including modified simple self-MLD, daily skin care and inspection, lymphatic pumping the ex and appropriate compression to limit lymphedema progression and to limit further functional decline. Baseline: Dependent Goal status: INITIAL   PLAN:  OT FREQUENCY: 2x/week  PT DURATION: 12 weeks and PRN  PLANNED INTERVENTIONS: Pt to return after consults with vascular surgeon and podiatry to determine if  OT for Complete Decongestive Therapy (CDT) is appropriate and in keeping with medical POCs. CDT to include multilayer compression wrapping, elevation, skin care, ther ex and manual lymphatic drainage. Pt will be fit with appropriate compression stocking at end of Intensive Phase. [id number]-Therapeutic exercises, 97530- Therapeutic activity, 97535- Self Care, [id number]- Taping, Compression bandaging, and DME instructions  Custom-made gradient compression garments and hours-of-sleep devices are medically necessary because they are uniquely sized and shaped to fit the exact dimensions of the affected extremities and to provide accurate and consistent gradient compression and containment, essential  for optimally managing chronic, progressive lymphedema. The convoluted HOS devices are medically necessary to facilitate increased lymphatic circulation and limit fibrosis formation  when sleeping. Multiple custom compression garments are needed for optimal hygiene to limit infection risk. Custom compression garments should be replaced q 3-6 months When worn consistently for optimal lymphedema self-management over time.  PLAN FOR NEXT SESSION:  BLE comparative limb volumetrics Compression wraps to R leg PRN  Loel Dubonnet, MS, OTR/L, CLT-LANA 12/05/23 1:03 PM

## 2023-12-17 ENCOUNTER — Other Ambulatory Visit: Payer: Self-pay | Admitting: Podiatry

## 2023-12-17 DIAGNOSIS — L03115 Cellulitis of right lower limb: Secondary | ICD-10-CM

## 2023-12-20 ENCOUNTER — Ambulatory Visit
Admission: RE | Admit: 2023-12-20 | Discharge: 2023-12-20 | Disposition: A | Discharge status: Home / Self Care | Payer: No Typology Code available for payment source | Source: Ambulatory Visit | Attending: Podiatry | Admitting: Podiatry

## 2023-12-20 DIAGNOSIS — L03115 Cellulitis of right lower limb: Secondary | ICD-10-CM | POA: Diagnosis present

## 2023-12-20 MED ORDER — GADOBUTROL 1 MMOL/ML IV SOLN
10.0000 mL | Freq: Once | INTRAVENOUS | Status: AC | PRN
  Administered 2023-12-20: 10 mL via INTRAVENOUS

## 2024-01-01 ENCOUNTER — Ambulatory Visit: Payer: Medicare HMO | Attending: Cardiology | Admitting: Cardiology

## 2024-01-01 ENCOUNTER — Encounter: Payer: Self-pay | Admitting: Cardiology

## 2024-01-01 VITALS — BP 130/84 | HR 77 | Ht 73.0 in | Wt 262.8 lb

## 2024-01-01 DIAGNOSIS — I10 Essential (primary) hypertension: Secondary | ICD-10-CM

## 2024-01-01 DIAGNOSIS — E782 Mixed hyperlipidemia: Secondary | ICD-10-CM | POA: Diagnosis not present

## 2024-01-01 DIAGNOSIS — I340 Nonrheumatic mitral (valve) insufficiency: Secondary | ICD-10-CM | POA: Diagnosis not present

## 2024-01-01 DIAGNOSIS — I890 Lymphedema, not elsewhere classified: Secondary | ICD-10-CM | POA: Diagnosis not present

## 2024-01-01 NOTE — Progress Notes (Addendum)
Cardiology Office Note:  .   Date:  01/01/2024  ID:  FINIS HENDRICKSEN, DOB 1966/09/29, MRN [id number] PCP: Clinic, [facility] Providers Cardiologist:  Debbe Odea, MD    History of Present Illness: .   JHONNY CALIXTO is a 58 y.o. male with past medical history of hypertension, hyperlipidemia, abnormal EKG, lymphedema of the right lower extremity, is here today to follow-up on his hypertension and hyperlipidemia.   He previously been seen 11/24/2022 to establish care.  He had previously been followed by the [facility] for medical care.  Prior echocardiogram that was obtained by his PCP was noted to be abnormal.  He denied any chest pain or shortness of breath.  He endorsed having right lower extremity edema and varicose veins.  He states his legs usually swell bilaterally if he has been on his feet for long period of time.  He was scheduled for an echocardiogram and a repeat lipid panel.  Echocardiogram revealed LVEF of 55 to 60%, no RWMA, G2 DD, mild to moderate mitral regurgitation.  He was last seen in clinic 02/23/23 overall been doing well from a cardiac perspective.  He denies any chest pain or shortness of breath.  Denies any hospitalizations or emergency room visits.  Was continued on his home medication regimen.  He does have been sent for an ultrasound for unilateral leg swelling and discomfort to rule out a DVT.  With continuous insidious right lower extremity swelling he was sent for lymphedema treatment.  He was last seen in clinic 12/03/2023 where he was wearing a thin Unna boot to the right foot and leg.  He also reported a nonhealing wound on his right foot and the dermatologist and put a wrap on several days ago.  He stated he has an upcoming podiatry consult in a few weeks.  He has a history of painful bone spurs and plantar fasciitis.   He returns to clinic today stating that he is doing well from the cardiac perspective.  He denies any chest pain, shortness of  breath, palpitations, lightheadedness or dizziness.  Continues to have unilateral swelling with swelling to the right lower extremity where he continues to be followed by lymphedema clinic.  Currently has an Radio broadcast assistant to the right lower extremity.  Fortunately states that he has not had his medication yet this morning so his blood pressure is slightly elevated from his normal.  States that he recently had labs done at the [place].  He has been following up with podiatry who has referred him to vascular through the [place] system.  Denies any recent hospitalizations or visits to the emergency department.  ROS: 10 point review of system has been reviewed and considered negative with exception was listed in HPI  Studies Reviewed: Marland Kitchen   EKG Interpretation Date/Time:  Tuesday January 01 2024 08:51:31 EST Ventricular Rate:  77 PR Interval:  144 QRS Duration:  88 QT Interval:  410 QTC Calculation: 463 R Axis:   8  Text Interpretation: Normal sinus rhythm Nonspecific T wave abnormality Prolonged QT Confirmed by Charlsie Quest ([id number]) on 01/01/2024 8:55:47 AM    TTE 12/29/22 1. Left ventricular ejection fraction, by estimation, is 55 to 60%. The  left ventricle has normal function. The left ventricle has no regional  wall motion abnormalities. There is mild to moderate left ventricular  hypertrophy. Left ventricular diastolic  parameters are consistent with Grade II diastolic dysfunction  (pseudonormalization).   2. Right ventricular systolic function is normal.  The right ventricular  size is mildly enlarged. There is normal pulmonary artery systolic  pressure. The estimated right ventricular systolic pressure is 25.1 mmHg.   3. Left atrial size was mildly dilated.   4. The mitral valve is normal in structure. Mild to moderate mitral valve  regurgitation. No evidence of mitral stenosis.   5. The aortic valve has an indeterminant number of cusps. Aortic valve  regurgitation is not visualized. No aortic  stenosis is present.   6. The inferior vena cava is normal in size with greater than 50%  respiratory variability, suggesting right atrial pressure of 3 mmHg.    Risk Assessment/Calculations:             Physical Exam:   VS:  BP 130/84 (BP Location: Left Arm, Patient Position: Sitting, Cuff Size: Large)   Pulse 77   Ht 6\' 1"  (1.854 m)   Wt 262 lb 12.8 oz (119.2 kg)   SpO2 97%   BMI 34.67 kg/m    Wt Readings from Last 3 Encounters:  01/01/24 262 lb 12.8 oz (119.2 kg)  02/23/23 252 lb 6.4 oz (114.5 kg)  11/24/22 250 lb 3.2 oz (113.5 kg)    GEN: Well nourished, well developed in no acute distress NECK: No JVD; No carotid bruits CARDIAC: RRR, no murmurs, rubs, gallops RESPIRATORY:  Clear to auscultation without rales, wheezing or rhonchi  ABDOMEN: Soft, non-tender, non-distended EXTREMITIES:  No edema; Unna boot to the right lower extremity clean/dry/intact, patient states he continues to have an ulcer to the inner aspect of the right ankle not visualized today due to [employer] being on.  No deformity   ASSESSMENT AND PLAN: .   Primary hypertension with a blood pressure today of 138/94 and 130/84.  Patient should have medications today.  He has been continued on lisinopril 10 mg daily.  He has been encouraged to continue to monitor his blood pressure 1 to 2 hours postmedication administration as well.  EKG today reveals sinus rhythm with rate of 77 nonspecific T wave abnormalities which is no acute change from prior study.  Right lower extremity lymphedema with current food intake.  He has out coming appointments with vascular in [place] through the [place] coming up.  He has talking about communicating today wanting to move that to a closer provider advised him to follow-up with the [place].  Hyperlipidemia with current unknown LDL.  Last LDL was 96 in 2024.  He was continued on simvastatin 10 mg daily.  We have requested most recent labs from the [place].  Mild to moderate MR Noted on  echocardiogram with an LVEF of 55 to 60%, no RWMA, G2 DD.  He continues to be asymptomatic.  Study was completed in 2024 will continue to monitor with surveillance studies every 3-5 year or as needed.    Dispo: Patient returns clinic to see MD/APP in 6 months or sooner for further evaluation.  Signed, Sigmond Patalano, NP

## 2024-01-01 NOTE — Patient Instructions (Signed)
Medication Instructions:  Your Physician recommend you continue on your current medication as directed.    *If you need a refill on your cardiac medications before your next appointment, please call your pharmacy*   Lab Work: None ordered at this time  If you have labs (blood work) drawn today and your tests are completely normal, you will receive your results only by: MyChart Message (if you have MyChart) OR A paper copy in the mail If you have any lab test that is abnormal or we need to change your treatment, we will call you to review the results.  We'll contact you through MyChart with the email address for you to send [place] your labs.  Follow-Up: At [facility], you and your health needs are our priority.  As part of our continuing mission to provide you with exceptional heart care, we have created designated Provider Care Teams.  These Care Teams include your primary Cardiologist (physician) and Advanced Practice Providers (APPs -  Physician Assistants and Nurse Practitioners) who all work together to provide you with the care you need, when you need it.  We recommend signing up for the patient portal called "MyChart".  Sign up information is provided on this After Visit Summary.  MyChart is used to connect with patients for Virtual Visits (Telemedicine).  Patients are able to view lab/test results, encounter notes, upcoming appointments, etc.  Non-urgent messages can be sent to your provider as well.   To learn more about what you can do with MyChart, go to [email or web address].    Your next appointment:   6 month(s)  Provider:   You may see Debbe Odea, MD or one of the following Advanced Practice Providers on your designated Care Team:   Nicolasa Ducking, NP Eula Listen, PA-C Cadence Fransico Michael, PA-C Charlsie Quest, NP Carlos Levering, NP

## 2024-01-29 ENCOUNTER — Ambulatory Visit (INDEPENDENT_AMBULATORY_CARE_PROVIDER_SITE_OTHER): Payer: Self-pay | Admitting: Vascular Surgery

## 2024-01-29 ENCOUNTER — Encounter (INDEPENDENT_AMBULATORY_CARE_PROVIDER_SITE_OTHER): Payer: Self-pay | Admitting: Vascular Surgery

## 2024-01-29 VITALS — BP 153/93 | HR 86 | Resp 18 | Ht 73.0 in | Wt 258.6 lb

## 2024-01-29 DIAGNOSIS — E785 Hyperlipidemia, unspecified: Secondary | ICD-10-CM | POA: Diagnosis not present

## 2024-01-29 DIAGNOSIS — I10 Essential (primary) hypertension: Secondary | ICD-10-CM

## 2024-01-29 DIAGNOSIS — L97311 Non-pressure chronic ulcer of right ankle limited to breakdown of skin: Secondary | ICD-10-CM | POA: Diagnosis not present

## 2024-01-29 NOTE — Assessment & Plan Note (Signed)
 lipid control important in reducing the progression of atherosclerotic disease. Continue statin therapy

## 2024-01-29 NOTE — Assessment & Plan Note (Signed)
 blood pressure control important in reducing the progression of atherosclerotic disease. On appropriate oral medications.

## 2024-01-29 NOTE — Progress Notes (Signed)
 Patient ID: Ricky Barrett, male   DOB: 16-Feb-1966, 58 y.o.   MRN: [id number]  Chief Complaint  Patient presents with   New Patient (Initial Visit)    NP. consult. right lower leg ulcer. [id number]     HPI Ricky Barrett is a 58 y.o. male.  I am asked to see the patient by Dr. Ebony Cargo for evaluation of swelling, discoloration, and a nonhealing ulceration of the right lower extremity.  His dermatologist has done an excellent job and his wound has been slowly progressing and is very small and clean at this point.  He has marked stasis dermatitis changes in the right medial lower leg.  He has had chronic issues with swelling and discoloration in this leg in the past.  This area is significantly painful now with the ulceration.  No fevers or chills.  He had a negative DVT study last year, but no functional venous Saxman or arterial assessment to my knowledge.     Past Medical History:  Diagnosis Date   Hypertension     History reviewed. No pertinent surgical history.   Family History  Problem Relation Age of Onset   Diabetes Mother    Hypertension Mother    Heart attack Father    Heart attack Paternal Grandfather       Social History   Tobacco Use   Smoking status: Never    Passive exposure: Past   Smokeless tobacco: Never  Substance Use Topics   Alcohol use: No   Drug use: No     Allergies  Allergen Reactions   Sildenafil Other (See Comments)   Sildenafil Citrate Other (See Comments)    Current Outpatient Medications  Medication Sig Dispense Refill   aspirin EC 81 MG tablet Take by mouth.     celecoxib (CELEBREX) 200 MG capsule Take 200 mg by mouth daily.     CIALIS 20 MG tablet Take 20 mg by mouth daily as needed.     clotrimazole-betamethasone (LOTRISONE) cream Apply to affected area 2 times daily x 7 days. Allow to dry completely after application. 15 g 0   Dietary Management Product (VASCULERA PO) Take 650 mg by mouth daily.     ibuprofen  (ADVIL) 600 MG tablet Take 1 tablet (600 mg total) by mouth every 6 (six) hours as needed. 30 tablet 0   lisinopril (PRINIVIL,ZESTRIL) 10 MG tablet Take 10 mg by mouth daily.     nystatin (MYCOSTATIN/NYSTOP) powder Apply 1 Application topically 3 (three) times daily. 15 g 0   omeprazole (PRILOSEC) 20 MG capsule Take 20 mg by mouth daily.     simvastatin (ZOCOR) 10 MG tablet Take 10 mg by mouth daily at 6 PM.     oseltamivir (TAMIFLU) 75 MG capsule Take 1 capsule (75 mg total) by mouth every 12 (twelve) hours. (Patient not taking: Reported on 01/01/2024) 10 capsule 0   No current facility-administered medications for this visit.      REVIEW OF SYSTEMS (Negative unless checked)  Constitutional: [] Weight loss  [] Fever  [] Chills Cardiac: [] Chest pain   [] Chest pressure   [] Palpitations   [] Shortness of breath when laying flat   [] Shortness of breath at rest   [] Shortness of breath with exertion. Vascular:  [] Pain in legs with walking   [] Pain in legs at rest   [] Pain in legs when laying flat   [] Claudication   [] Pain in feet when walking  [] Pain in feet at rest  [] Pain in feet when laying flat   []   History of DVT   [] Phlebitis   [x] Swelling in legs   [] Varicose veins   [] Non-healing ulcers Pulmonary:   [] Uses home oxygen   [] Productive cough   [] Hemoptysis   [] Wheeze  [] COPD   [] Asthma Neurologic:  [] Dizziness  [] Blackouts   [] Seizures   [] History of stroke   [] History of TIA  [] Aphasia   [] Temporary blindness   [] Dysphagia   [] Weakness or numbness in arms   [] Weakness or numbness in legs Musculoskeletal:  [] Arthritis   [] Joint swelling   [] Joint pain   [] Low back pain Hematologic:  [] Easy bruising  [] Easy bleeding   [] Hypercoagulable state   [] Anemic  [] Hepatitis Gastrointestinal:  [] Blood in stool   [] Vomiting blood  [] Gastroesophageal reflux/heartburn   [] Abdominal pain Genitourinary:  [] Chronic kidney disease   [] Difficult urination  [] Frequent urination  [] Burning with urination    [] Hematuria Skin:  [] Rashes   [x] Ulcers   [x] Wounds Psychological:  [] History of anxiety   []  History of major depression.    Physical Exam BP (!) 153/93   Pulse 86   Resp 18   Ht 6\' 1"  (1.854 m)   Wt 258 lb 9.6 oz (117.3 kg)   BMI 34.12 kg/m  Gen:  WD/WN, NAD Head: [place]/AT, No temporalis wasting.  Ear/Nose/Throat: Hearing grossly intact, nares w/o erythema or drainage, oropharynx w/o Erythema/Exudate Eyes: Conjunctiva clear, sclera non-icteric  Neck: trachea midline.  No JVD.  Pulmonary:  Good air movement, respirations not labored, no use of accessory muscles  Cardiac: RRR, no JVD Vascular:  Vessel Right Left  Radial Palpable Palpable                          PT NP 2+  DP 1+ 2+   Gastrointestinal:. No masses, surgical incisions, or scars. Musculoskeletal: M/S 5/5 throughout.  Extremities without ischemic changes.  No deformity or atrophy.  Small somewhat circular ulceration on the right medial lower leg.  This is associated with marked stasis dermatitis changes in the right medial lower leg.  1+ right lower extremity edema. Neurologic: Sensation grossly intact in extremities.  Symmetrical.  Speech is fluent. Motor exam as listed above. Psychiatric: Judgment intact, Mood & affect appropriate for pt's clinical situation. Dermatologic: Right medial lower leg ulceration as above    Radiology No results found.  Labs No results found for this or any previous visit (from the past 2160 hours).  Assessment/Plan:  Essential hypertension, benign blood pressure control important in reducing the progression of atherosclerotic disease. On appropriate oral medications.   Dyslipidemia lipid control important in reducing the progression of atherosclerotic disease. Continue statin therapy   Lower limb ulcer, ankle, right, limited to breakdown of skin [facility]) The patient has been getting excellent local wound care from his dermatologist.  In a patient with multiple  atherosclerotic risk factors such as hypertension and hyperlipidemia, I am going to check his ABIs to ensure that his arterial perfusion is intact.  He also obviously needs a venous reflux study in the near future at his convenience for evaluation of potential significant venous disease contributing to the nonhealing ulceration.  We placed a 3 layer Unna boot today and that will be changed as it has been by his dermatologist.  This has done an excellent job of managing the wound.  Once his wound has healed, he will need to wear 20 to 30 mmHg compression socks on a daily basis.  He should elevate his legs.  He should exercise and  walk regularly.  We will see him back following his noninvasive studies.      Festus Barren 02/01/2024, 5:02 PM   This note was created with Dragon medical transcription system.  Any errors from dictation are unintentional.

## 2024-02-01 NOTE — Assessment & Plan Note (Signed)
 The patient has been getting excellent local wound care from his dermatologist.  In a patient with multiple atherosclerotic risk factors such as hypertension and hyperlipidemia, I am going to check his ABIs to ensure that his arterial perfusion is intact.  He also obviously needs a venous reflux study in the near future at his convenience for evaluation of potential significant venous disease contributing to the nonhealing ulceration.  We placed a 3 layer Unna boot today and that will be changed as it has been by his dermatologist.  This has done an excellent job of managing the wound.  Once his wound has healed, he will need to wear 20 to 30 mmHg compression socks on a daily basis.  He should elevate his legs.  He should exercise and walk regularly.  We will see him back following his noninvasive studies.

## 2024-02-04 ENCOUNTER — Ambulatory Visit (INDEPENDENT_AMBULATORY_CARE_PROVIDER_SITE_OTHER): Admitting: Nurse Practitioner

## 2024-02-04 ENCOUNTER — Encounter (INDEPENDENT_AMBULATORY_CARE_PROVIDER_SITE_OTHER): Payer: Self-pay

## 2024-02-04 VITALS — BP 140/91 | HR 86 | Resp 16 | Wt 261.8 lb

## 2024-02-04 DIAGNOSIS — L97311 Non-pressure chronic ulcer of right ankle limited to breakdown of skin: Secondary | ICD-10-CM

## 2024-02-04 NOTE — Progress Notes (Signed)
 History of Present Illness  There is no documented history at this time  Assessments & Plan   There are no diagnoses linked to this encounter.    Additional instructions  Subjective:  Patient presents with venous ulcer of the Right lower extremity.    Procedure:  3 layer unna wrap was placed Right lower extremity.   Plan:   Follow up in one week.

## 2024-02-11 ENCOUNTER — Encounter (INDEPENDENT_AMBULATORY_CARE_PROVIDER_SITE_OTHER): Payer: Self-pay

## 2024-02-11 ENCOUNTER — Ambulatory Visit (INDEPENDENT_AMBULATORY_CARE_PROVIDER_SITE_OTHER): Admitting: Nurse Practitioner

## 2024-02-11 VITALS — BP 137/90 | HR 75 | Resp 16

## 2024-02-11 DIAGNOSIS — L97311 Non-pressure chronic ulcer of right ankle limited to breakdown of skin: Secondary | ICD-10-CM | POA: Diagnosis not present

## 2024-02-11 NOTE — Progress Notes (Signed)
 History of Present Illness  There is no documented history at this time  Assessments & Plan   There are no diagnoses linked to this encounter.    Additional instructions  Subjective:  Patient presents with venous ulcer of the Right lower extremity.    Procedure:  3 layer unna wrap was placed Right lower extremity.   Plan:   Follow up in one week.

## 2024-02-18 ENCOUNTER — Encounter (INDEPENDENT_AMBULATORY_CARE_PROVIDER_SITE_OTHER): Payer: Self-pay

## 2024-02-18 ENCOUNTER — Ambulatory Visit (INDEPENDENT_AMBULATORY_CARE_PROVIDER_SITE_OTHER): Admitting: Nurse Practitioner

## 2024-02-18 VITALS — BP 143/89 | HR 73 | Resp 18 | Ht 73.0 in | Wt 261.0 lb

## 2024-02-18 DIAGNOSIS — L97311 Non-pressure chronic ulcer of right ankle limited to breakdown of skin: Secondary | ICD-10-CM

## 2024-02-18 NOTE — Progress Notes (Signed)
 History of Present Illness  There is no documented history at this time  Assessments & Plan   There are no diagnoses linked to this encounter.    Additional instructions  Subjective:  Patient presents with venous ulcer of the Right lower extremity.    Procedure:  3 layer unna wrap was placed Right lower extremity.   Plan:   Follow up in one week.

## 2024-02-25 ENCOUNTER — Ambulatory Visit (INDEPENDENT_AMBULATORY_CARE_PROVIDER_SITE_OTHER): Admitting: Nurse Practitioner

## 2024-02-25 ENCOUNTER — Encounter (INDEPENDENT_AMBULATORY_CARE_PROVIDER_SITE_OTHER): Payer: Self-pay

## 2024-02-25 VITALS — BP 144/98 | HR 80 | Resp 18

## 2024-02-25 DIAGNOSIS — L97311 Non-pressure chronic ulcer of right ankle limited to breakdown of skin: Secondary | ICD-10-CM

## 2024-02-28 NOTE — Progress Notes (Signed)
 History of Present Illness  There is no documented history at this time  Assessments & Plan   There are no diagnoses linked to this encounter.    Additional instructions  Subjective:  Patient presents with venous ulcer of the Right lower extremity.    Procedure:  3 layer unna wrap was placed Right lower extremity.   Plan:   Follow up in one week.

## 2024-03-04 ENCOUNTER — Encounter (INDEPENDENT_AMBULATORY_CARE_PROVIDER_SITE_OTHER): Payer: Self-pay | Admitting: Nurse Practitioner

## 2024-03-04 ENCOUNTER — Ambulatory Visit (INDEPENDENT_AMBULATORY_CARE_PROVIDER_SITE_OTHER): Admitting: Nurse Practitioner

## 2024-03-04 DIAGNOSIS — L97321 Non-pressure chronic ulcer of left ankle limited to breakdown of skin: Secondary | ICD-10-CM

## 2024-03-04 DIAGNOSIS — L97311 Non-pressure chronic ulcer of right ankle limited to breakdown of skin: Secondary | ICD-10-CM

## 2024-03-04 NOTE — Progress Notes (Signed)
 History of Present Illness  There is no documented history at this time  Assessments & Plan   There are no diagnoses linked to this encounter.    Additional instructions  Subjective:  Patient presents with venous ulcer of the Left lower extremity.    Procedure:  3 layer unna wrap was placed Left lower extremity.   Plan:   Follow up in one week.

## 2024-03-11 ENCOUNTER — Ambulatory Visit (INDEPENDENT_AMBULATORY_CARE_PROVIDER_SITE_OTHER)

## 2024-03-11 ENCOUNTER — Ambulatory Visit (INDEPENDENT_AMBULATORY_CARE_PROVIDER_SITE_OTHER): Admitting: Vascular Surgery

## 2024-03-11 ENCOUNTER — Encounter (INDEPENDENT_AMBULATORY_CARE_PROVIDER_SITE_OTHER): Payer: Self-pay | Admitting: Vascular Surgery

## 2024-03-11 VITALS — BP 130/87 | HR 78 | Resp 18 | Ht 73.0 in | Wt 261.0 lb

## 2024-03-11 DIAGNOSIS — I10 Essential (primary) hypertension: Secondary | ICD-10-CM | POA: Diagnosis not present

## 2024-03-11 DIAGNOSIS — L97311 Non-pressure chronic ulcer of right ankle limited to breakdown of skin: Secondary | ICD-10-CM

## 2024-03-11 DIAGNOSIS — E785 Hyperlipidemia, unspecified: Secondary | ICD-10-CM | POA: Diagnosis not present

## 2024-03-11 NOTE — Assessment & Plan Note (Signed)
 Noninvasive study today shows no evidence of DVT or superficial thrombophlebitis throughout the right lower extremity.  The only area of venous reflux noted was in the right popliteal vein.  We also performed arterial studies today showing a right ABI of 1.21 and a left ABI of 1.19 with triphasic waveforms and normal digital pressures consistent with no arterial insufficiency. His wound is now healed.  Will going to come out of the Unna boots and go to compression socks daily.  Have given him a prescription for compression socks today.  I will plan to see him back in 3 to 4 months.

## 2024-03-11 NOTE — Progress Notes (Signed)
 MRN : [id number]  Ricky Barrett is a 58 y.o. (06/09/1966) male who presents with chief complaint of  Chief Complaint  Patient presents with   Follow-up    fu pt conv R Reflux  .  History of Present Illness: Patient returns today in follow up of his right leg swelling and ulceration.  After about 6 weeks in Unna boots, his right ankle ulceration has healed.  His swelling is markedly improved.  He is not having significant pain currently.  Noninvasive study today shows no evidence of DVT or superficial thrombophlebitis throughout the right lower extremity.  The only area of venous reflux noted was in the right popliteal vein.  We also performed arterial studies today showing a right ABI of 1.21 and a left ABI of 1.19 with triphasic waveforms and normal digital pressures consistent with no arterial insufficiency.  Current Outpatient Medications  Medication Sig Dispense Refill   aspirin EC 81 MG tablet Take by mouth.     celecoxib (CELEBREX) 200 MG capsule Take 200 mg by mouth daily.     CIALIS 20 MG tablet Take 20 mg by mouth daily as needed.     clotrimazole  (LOTRIMIN ) 1 % cream Apply topically.     clotrimazole -betamethasone  (LOTRISONE ) cream Apply to affected area 2 times daily x 7 days. Allow to dry completely after application. 15 g 0   Dietary Management Product (VASCULERA PO) Take 650 mg by mouth daily.     ibuprofen  (ADVIL ) 600 MG tablet Take 1 tablet (600 mg total) by mouth every 6 (six) hours as needed. 30 tablet 0   lisinopril (PRINIVIL,ZESTRIL) 10 MG tablet Take 10 mg by mouth daily.     nystatin  (MYCOSTATIN /NYSTOP ) powder Apply 1 Application topically 3 (three) times daily. 15 g 0   omeprazole (PRILOSEC) 20 MG capsule Take 20 mg by mouth daily.     oseltamivir  (TAMIFLU ) 75 MG capsule Take 1 capsule (75 mg total) by mouth every 12 (twelve) hours. 10 capsule 0   simvastatin (ZOCOR) 10 MG tablet Take 10 mg by mouth daily at 6 PM.     No current facility-administered medications  for this visit.    Past Medical History:  Diagnosis Date   Hypertension     History reviewed. No pertinent surgical history.   Social History   Tobacco Use   Smoking status: Never    Passive exposure: Past   Smokeless tobacco: Never  Substance Use Topics   Alcohol use: No   Drug use: No      Family History  Problem Relation Age of Onset   Diabetes Mother    Hypertension Mother    Heart attack Father    Heart attack Paternal Grandfather      Allergies  Allergen Reactions   Sildenafil Other (See Comments)   Sildenafil Citrate Other (See Comments)      REVIEW OF SYSTEMS (Negative unless checked)   Constitutional: [] Weight loss  [] Fever  [] Chills Cardiac: [] Chest pain   [] Chest pressure   [] Palpitations   [] Shortness of breath when laying flat   [] Shortness of breath at rest   [] Shortness of breath with exertion. Vascular:  [] Pain in legs with walking   [] Pain in legs at rest   [] Pain in legs when laying flat   [] Claudication   [] Pain in feet when walking  [] Pain in feet at rest  [] Pain in feet when laying flat   [] History of DVT   [] Phlebitis   [x] Swelling in legs   []   Varicose veins   [] Non-healing ulcers Pulmonary:   [] Uses home oxygen   [] Productive cough   [] Hemoptysis   [] Wheeze  [] COPD   [] Asthma Neurologic:  [] Dizziness  [] Blackouts   [] Seizures   [] History of stroke   [] History of TIA  [] Aphasia   [] Temporary blindness   [] Dysphagia   [] Weakness or numbness in arms   [] Weakness or numbness in legs Musculoskeletal:  [] Arthritis   [] Joint swelling   [] Joint pain   [] Low back pain Hematologic:  [] Easy bruising  [] Easy bleeding   [] Hypercoagulable state   [] Anemic  [] Hepatitis Gastrointestinal:  [] Blood in stool   [] Vomiting blood  [] Gastroesophageal reflux/heartburn   [] Abdominal pain Genitourinary:  [] Chronic kidney disease   [] Difficult urination  [] Frequent urination  [] Burning with urination   [] Hematuria Skin:  [] Rashes   [x] Ulcers   [x] Wounds Psychological:   [] History of anxiety   []  History of major depression.  Physical Examination  BP 130/87   Pulse 78   Resp 18   Ht 6\' 1"  (1.854 m)   Wt 261 lb (118.4 kg)   BMI 34.43 kg/m  Gen:  WD/WN, NAD Head: [place]/AT, No temporalis wasting. Ear/Nose/Throat: Hearing grossly intact, nares w/o erythema or drainage Eyes: Conjunctiva clear. Sclera non-icteric Neck: Supple.  Trachea midline Pulmonary:  Good air movement, no use of accessory muscles.  Cardiac: RRR, no JVD Vascular:  Vessel Right Left  Radial Palpable Palpable                          PT Palpable Palpable  DP Palpable Palpable   Gastrointestinal: soft, non-tender/non-distended. No guarding/reflex.  Musculoskeletal: M/S 5/5 throughout.  No deformity or atrophy.  Moderate stasis dermatitis changes are present in the right lower extremity.  Essentially no edema is present after several weeks in Unna boots.  His wound has healed.. Neurologic: Sensation grossly intact in extremities.  Symmetrical.  Speech is fluent.  Psychiatric: Judgment intact, Mood & affect appropriate for pt's clinical situation. Dermatologic: No rashes or ulcers noted.  No cellulitis or open wounds.      Labs No results found for this or any previous visit (from the past 2160 hours).  Radiology No results found.  Assessment/Plan  Lower limb ulcer, ankle, right, limited to breakdown of skin (HCC) Noninvasive study today shows no evidence of DVT or superficial thrombophlebitis throughout the right lower extremity.  The only area of venous reflux noted was in the right popliteal vein.  We also performed arterial studies today showing a right ABI of 1.21 and a left ABI of 1.19 with triphasic waveforms and normal digital pressures consistent with no arterial insufficiency. His wound is now healed.  Will going to come out of the Unna boots and go to compression socks daily.  Have given him a prescription for compression socks today.  I will plan to see him back in  3 to 4 months.  Essential hypertension, benign blood pressure control important in reducing the progression of atherosclerotic disease. On appropriate oral medications.     Dyslipidemia lipid control important in reducing the progression of atherosclerotic disease. Continue statin therapy  Mikki Alexander, MD  03/11/2024 3:48 PM    This note was created with Dragon medical transcription system.  Any errors from dictation are purely unintentional

## 2024-04-01 ENCOUNTER — Encounter (INDEPENDENT_AMBULATORY_CARE_PROVIDER_SITE_OTHER): Payer: Self-pay

## 2024-05-22 ENCOUNTER — Encounter: Payer: Self-pay | Admitting: Cardiology

## 2024-05-22 ENCOUNTER — Ambulatory Visit: Attending: Cardiology | Admitting: Cardiology

## 2024-05-22 VITALS — BP 118/78 | HR 58 | Ht 73.0 in | Wt 254.4 lb

## 2024-05-22 DIAGNOSIS — Z6833 Body mass index (BMI) 33.0-33.9, adult: Secondary | ICD-10-CM | POA: Diagnosis not present

## 2024-05-22 DIAGNOSIS — I10 Essential (primary) hypertension: Secondary | ICD-10-CM

## 2024-05-22 DIAGNOSIS — E782 Mixed hyperlipidemia: Secondary | ICD-10-CM | POA: Diagnosis not present

## 2024-05-22 DIAGNOSIS — I340 Nonrheumatic mitral (valve) insufficiency: Secondary | ICD-10-CM | POA: Diagnosis not present

## 2024-05-22 MED ORDER — WEGOVY 0.25 MG/0.5ML ~~LOC~~ SOAJ: 0.2500 mg | SUBCUTANEOUS | 0 refills | Status: AC

## 2024-05-22 NOTE — Progress Notes (Signed)
 Cardiology Office Note:    Date:  05/22/2024   ID:  SHELLEY POOLEY, DOB 1966/06/20, MRN [id number]  PCP:  [facility]   [facility] Providers Cardiologist:  Redell Cave, MD     Referring MD: [facility]   Chief Complaint  Patient presents with   Follow-up    5 month follow up pat has been doing well with no complaints of chest pain, chest pressure or SOB, medciation reviewed verbally with patient    History of Present Illness:    Ricky Barrett is a 58 y.o. male with a hx of hypertension, hyperlipidemia, mild to moderate MR who presents for follow-up.   Last seen for hypertension, hyperlipidemia.  Compliant with lisinopril, simvastatin as prescribed.  Has been exercising more, eating healthy but his weight is not ideal.  Denies chest pain or shortness of breath.  Compliant with meds as prescribed.  Follows up with vascular surgery for right lower extremity varicose veins and likely venous ulcer.  Compression stockings were recommended, edema and ulcer/wound improved.  Prior notes/testing Echo 12/2022 EF 55 to 60%, mild to moderate MR. Ultrasound lower extremity showed right popliteal vein venous reflux.  Past Medical History:  Diagnosis Date   Hypertension     No past surgical history on file.  Current Medications: Current Meds  Medication Sig   aspirin EC 81 MG tablet Take by mouth.   CIALIS 20 MG tablet Take 20 mg by mouth daily as needed.   Dietary Management Product (VASCULERA PO) Take 650 mg by mouth daily.   ibuprofen  (ADVIL ) 600 MG tablet Take 1 tablet (600 mg total) by mouth every 6 (six) hours as needed.   lisinopril (PRINIVIL,ZESTRIL) 10 MG tablet Take 10 mg by mouth daily.   omeprazole (PRILOSEC) 20 MG capsule Take 20 mg by mouth daily.   Semaglutide -Weight Management (WEGOVY ) 0.25 MG/0.5ML SOAJ Inject 0.25 mg into the skin once a week. FOR 4 WEEKS   simvastatin (ZOCOR) 10 MG tablet Take 10 mg by mouth daily at 6 PM.      Allergies:   Sildenafil and Sildenafil citrate   Social History   Socioeconomic History   Marital status: Married    Spouse name: Not on file   Number of children: Not on file   Years of education: Not on file   Highest education level: Not on file  Occupational History   Not on file  Tobacco Use   Smoking status: Never    Passive exposure: Past   Smokeless tobacco: Never  Substance and Sexual Activity   Alcohol use: No   Drug use: No   Sexual activity: Not on file  Other Topics Concern   Not on file  Social History Narrative   Not on file   Social Drivers of Health   Financial Resource Strain: Not on file  Food Insecurity: Not on file  Transportation Needs: Not on file  Physical Activity: Not on file  Stress: Not on file  Social Connections: Not on file     Family History: The patient's family history includes Diabetes in his mother; Heart attack in his father and paternal grandfather; Hypertension in his mother.  ROS:   Please see the history of present illness.     All other systems reviewed and are negative.  EKGs/Labs/Other Studies Reviewed:    The following studies were reviewed today:  EKG Interpretation Date/Time:  Thursday May 22 2024 08:55:48 EDT Ventricular Rate:  58 PR Interval:  144  QRS Duration:  94 QT Interval:  454 QTC Calculation: 445 R Axis:   18  Text Interpretation: Sinus bradycardia Nonspecific T wave abnormality Confirmed by Darliss Rogue ([id number]) on 05/22/2024 9:26:19 AM    Recent Labs: No results found for requested labs within last 365 days.  Recent Lipid Panel    Component Value Date/Time   CHOL 148 12/29/2022 0813   TRIG 84 12/29/2022 0813   HDL 35 (L) 12/29/2022 0813   CHOLHDL 4.2 12/29/2022 0813   VLDL 17 12/29/2022 0813   LDLCALC 96 12/29/2022 0813     Risk Assessment/Calculations:               Physical Exam:    VS:  BP 118/78 (BP Location: Left Arm, Patient Position: Sitting, Cuff Size: Normal)    Pulse (!) 58   Ht 6' 1 (1.854 m)   Wt 254 lb 6.4 oz (115.4 kg)   SpO2 97%   BMI 33.56 kg/m     Wt Readings from Last 3 Encounters:  05/22/24 254 lb 6.4 oz (115.4 kg)  03/11/24 261 lb (118.4 kg)  02/18/24 261 lb (118.4 kg)     GEN:  Well nourished, well developed in no acute distress HEENT: Normal NECK: No JVD; No carotid bruits CARDIAC: RRR, no murmurs, rubs, gallops RESPIRATORY:  Clear to auscultation without rales, wheezing or rhonchi  ABDOMEN: Soft, non-tender, non-distended MUSCULOSKELETAL:  No edema; right foot in compression stockings. SKIN: Warm and dry NEUROLOGIC:  Alert and oriented x 3 PSYCHIATRIC:  Normal affect   ASSESSMENT:    1. Primary hypertension   2. Mixed hyperlipidemia   3. Nonrheumatic mitral valve regurgitation   4. BMI 33.0-33.9,adult    PLAN:    In order of problems listed above:  Hypertension, BP controlled.  Continue lisinopril 10 mg daily. Hyperlipidemia, cholesterol controlled.  Continue simvastatin 10 mg daily. Echo 12/2022 mild to moderate MR.  Repeat echo in 1 year. Obesity, also has hypertension, hyperlipidemia.  Wants to try GLP-1 if insurance approves.  I think weight loss will help for blood pressure and cholesterol control.  Follow-up in 3 to 6 months.      Medication Adjustments/Labs and Tests Ordered: Current medicines are reviewed at length with the patient today.  Concerns regarding medicines are outlined above.  Orders Placed This Encounter  Procedures   EKG 12-Lead   ECHOCARDIOGRAM COMPLETE   Meds ordered this encounter  Medications   Semaglutide -Weight Management (WEGOVY ) 0.25 MG/0.5ML SOAJ    Sig: Inject 0.25 mg into the skin once a week. FOR 4 WEEKS    Dispense:  2 mL    Refill:  0    Patient Instructions  Medication Instructions:  Your physician recommends the following medication changes.  START TAKING: Start taking Wegovy   Month 1: 0.25 mg once a week.  Month 2: 0.5 mg once a week. (Call or send us  a  MyChart message when you are on week 2, so we can send your next dose in for you).  Month 3: 1 mg once a week.  Month 4: 1.7 mg once a week.  Month 5 and beyond: 2.4 mg once a week (maintenance dose)    *If you need a refill on your cardiac medications before your next appointment, please call your pharmacy*  Lab Work: No labs ordered today    Testing/Procedures: Your physician has requested that you have an echocardiogram in 1 year. Echocardiography is a painless test that uses sound waves to create images of your  heart. It provides your doctor with information about the size and shape of your heart and how well your heart's chambers and valves are working.   You may receive an ultrasound enhancing agent through an IV if needed to better visualize your heart during the echo. This procedure takes approximately one hour.  There are no restrictions for this procedure.  This will take place at [address] [facility]) [address], [place] [address]  Please note: We ask at that you not bring children with you during ultrasound (echo/ vascular) testing. Due to room size and safety concerns, children are not allowed in the ultrasound rooms during exams. Our front office staff cannot provide observation of children in our lobby area while testing is being conducted. An adult accompanying a patient to their appointment will only be allowed in the ultrasound room at the discretion of the ultrasound technician under special circumstances. We apologize for any inconvenience.   Follow-Up: At [facility], you and your health needs are our priority.  As part of our continuing mission to provide you with exceptional heart care, our providers are all part of one team.  This team includes your primary Cardiologist (physician) and Advanced Practice Providers or APPs (Physician Assistants and Nurse Practitioners) who all work together to provide you with the care you need, when you need  it.  Your next appointment:   6 month(s)  Provider:   You may see Redell Cave, MD or one of the following Advanced Practice Providers on your designated Care Team:   Lonni Meager, NP Lesley Maffucci, PA-C Bernardino Bring, PA-C Cadence Franchester, PA-C Tylene Lunch, NP Barnie Hila, NP       Signed, Redell Cave, MD  05/22/2024 10:19 AM    [facility]

## 2024-05-22 NOTE — Patient Instructions (Signed)
 Medication Instructions:  Your physician recommends the following medication changes.  START TAKING: Start taking Wegovy   Month 1: 0.25 mg once a week.  Month 2: 0.5 mg once a week. (Call or send us  a MyChart message when you are on week 2, so we can send your next dose in for you).  Month 3: 1 mg once a week.  Month 4: 1.7 mg once a week.  Month 5 and beyond: 2.4 mg once a week (maintenance dose)    *If you need a refill on your cardiac medications before your next appointment, please call your pharmacy*  Lab Work: No labs ordered today    Testing/Procedures: Your physician has requested that you have an echocardiogram in 1 year. Echocardiography is a painless test that uses sound waves to create images of your heart. It provides your doctor with information about the size and shape of your heart and how well your heart's chambers and valves are working.   You may receive an ultrasound enhancing agent through an IV if needed to better visualize your heart during the echo. This procedure takes approximately one hour.  There are no restrictions for this procedure.  This will take place at [address] [facility] [address], [place] [address]  Please note: We ask at that you not bring children with you during ultrasound (echo/ vascular) testing. Due to room size and safety concerns, children are not allowed in the ultrasound rooms during exams. Our front office staff cannot provide observation of children in our lobby area while testing is being conducted. An adult accompanying a patient to their appointment will only be allowed in the ultrasound room at the discretion of the ultrasound technician under special circumstances. We apologize for any inconvenience.   Follow-Up: At [facility], you and your health needs are our priority.  As part of our continuing mission to provide you with exceptional heart care, our providers are all part of one team.  This team  includes your primary Cardiologist (physician) and Advanced Practice Providers or APPs (Physician Assistants and Nurse Practitioners) who all work together to provide you with the care you need, when you need it.  Your next appointment:   6 month(s)  Provider:   You may see Redell Cave, MD or one of the following Advanced Practice Providers on your designated Care Team:   Lonni Meager, NP Lesley Maffucci, PA-C Bernardino Bring, PA-C Cadence [place], PA-C Tylene Lunch, NP Barnie Hila, NP

## 2024-06-13 ENCOUNTER — Ambulatory Visit (INDEPENDENT_AMBULATORY_CARE_PROVIDER_SITE_OTHER): Admitting: Vascular Surgery

## 2024-06-13 ENCOUNTER — Encounter (INDEPENDENT_AMBULATORY_CARE_PROVIDER_SITE_OTHER): Payer: Self-pay | Admitting: Vascular Surgery

## 2024-06-13 VITALS — BP 136/87 | HR 69 | Resp 18 | Wt 254.2 lb

## 2024-06-13 DIAGNOSIS — L97311 Non-pressure chronic ulcer of right ankle limited to breakdown of skin: Secondary | ICD-10-CM

## 2024-06-13 DIAGNOSIS — E785 Hyperlipidemia, unspecified: Secondary | ICD-10-CM

## 2024-06-13 DIAGNOSIS — I10 Essential (primary) hypertension: Secondary | ICD-10-CM

## 2024-06-13 NOTE — Progress Notes (Signed)
 Subjective:    Patient ID: Ricky Barrett, male    DOB: 1966-09-14, 58 y.o.   MRN: [id number] Chief Complaint  Patient presents with   Follow-up    3-4 month follow up    Patient returns today in follow up of his right leg swelling and ulceration.  After about 6 weeks in [place] boots, his right ankle ulceration has healed.  His swelling is markedly improved.  He is not having significant pain currently.  On last visit Noninvasive study shows no evidence of DVT or superficial thrombophlebitis throughout the right lower extremity.  The only area of venous reflux noted was in the right popliteal vein. Last visit also completed arterial studies showing a right ABI of 1.21 and a left ABI of 1.19 with triphasic waveforms and normal digital pressures consistent with no arterial insufficiency. No changes today from previous visit. Patient endorses doing well.     Review of Systems  Constitutional: Negative.   Skin:  Positive for color change and wound.  All other systems reviewed and are negative.      Objective:   Physical Exam Vitals reviewed.  Constitutional:      Appearance: Normal appearance. He is obese.  HENT:     Head: Normocephalic.  Eyes:     Pupils: Pupils are equal, round, and reactive to light.  Cardiovascular:     Rate and Rhythm: Normal rate and regular rhythm.     Pulses: Normal pulses.     Heart sounds: Normal heart sounds.  Pulmonary:     Effort: Pulmonary effort is normal.     Breath sounds: Normal breath sounds.  Abdominal:     General: Bowel sounds are normal.     Palpations: Abdomen is soft.  Musculoskeletal:     Right lower leg: Edema present.  Skin:    General: Skin is warm and dry.     Capillary Refill: Capillary refill takes 2 to 3 seconds.  Neurological:     General: No focal deficit present.     Mental Status: He is alert and oriented to person, place, and time. Mental status is at baseline.  Psychiatric:        Mood and Affect: Mood normal.         Behavior: Behavior normal.        Thought Content: Thought content normal.        Judgment: Judgment normal.     BP 136/87   Pulse 69   Resp 18   Wt 254 lb 3.2 oz (115.3 kg)   BMI 33.54 kg/m   Past Medical History:  Diagnosis Date   Hypertension     Social History   Socioeconomic History   Marital status: Married    Spouse name: Not on file   Number of children: Not on file   Years of education: Not on file   Highest education level: Not on file  Occupational History   Not on file  Tobacco Use   Smoking status: Never    Passive exposure: Past   Smokeless tobacco: Never  Substance and Sexual Activity   Alcohol use: No   Drug use: No   Sexual activity: Not on file  Other Topics Concern   Not on file  Social History Narrative   Not on file   Social Drivers of Health   Financial Resource Strain: Not on file  Food Insecurity: Not on file  Transportation Needs: Not on file  Physical Activity: Not on file  Stress: Not on file  Social Connections: Not on file  Intimate Partner Violence: Not on file    History reviewed. No pertinent surgical history.  Family History  Problem Relation Age of Onset   Diabetes Mother    Hypertension Mother    Heart attack Father    Heart attack Paternal Grandfather     Allergies  Allergen Reactions   Sildenafil Other (See Comments)   Sildenafil Citrate Other (See Comments)       Latest Ref Rng & Units 03/02/2013   11:28 PM 04/10/2008   12:00 AM  CBC  WBC 4.0 - 10.5 K/uL 7.2    Hemoglobin 13.0 - 17.0 g/dL 85.5  85.3   Hematocrit 39.0 - 52.0 % 40.6  46   Platelets 150 - 400 K/uL 188        CMP     Component Value Date/Time   NA 140 03/02/2013 2328   K 4.1 03/02/2013 2328   CL 101 03/02/2013 2328   CO2 30 03/02/2013 2328   GLUCOSE 118 (H) 03/02/2013 2328   BUN 18 03/02/2013 2328   CREATININE 1.43 (H) 03/02/2013 2328   CALCIUM 9.4 03/02/2013 2328   PROT 7.3 03/02/2013 2328   ALBUMIN 3.6 03/02/2013 2328   AST  34 03/02/2013 2328   ALT 35 03/02/2013 2328   ALKPHOS 66 03/02/2013 2328   BILITOT 0.3 03/02/2013 2328   GFRNONAA 58 (L) 03/02/2013 2328     No results found.     Assessment & Plan:   1. Lower limb ulcer, ankle, right, limited to breakdown of skin (HCC) (Primary) Patients wound is now healed. He is wearing compression daily. Swelling is very minimal. Patient to follow up PRN for any further increase in swelling or ulceration. Encouraged patient to continue to wear compression daily. Patient agrees with the plan.  2. Essential hypertension, benign Continue antihypertensive medications as already ordered, these medications have been reviewed and there are no changes at this time.  3. Dyslipidemia Continue statin as ordered and reviewed, no changes at this time   Current Outpatient Medications on File Prior to Visit  Medication Sig Dispense Refill   aspirin EC 81 MG tablet Take by mouth.     CIALIS 20 MG tablet Take 20 mg by mouth daily as needed.     Dietary Management Product (VASCULERA PO) Take 650 mg by mouth daily.     ibuprofen  (ADVIL ) 600 MG tablet Take 1 tablet (600 mg total) by mouth every 6 (six) hours as needed. 30 tablet 0   lisinopril (PRINIVIL,ZESTRIL) 10 MG tablet Take 10 mg by mouth daily.     omeprazole (PRILOSEC) 20 MG capsule Take 20 mg by mouth daily.     Semaglutide -Weight Management (WEGOVY ) 0.25 MG/0.5ML SOAJ Inject 0.25 mg into the skin once a week. FOR 4 WEEKS 2 mL 0   simvastatin (ZOCOR) 10 MG tablet Take 10 mg by mouth daily at 6 PM.     celecoxib (CELEBREX) 200 MG capsule Take 200 mg by mouth daily. (Patient not taking: Reported on 06/13/2024)     clotrimazole  (LOTRIMIN ) 1 % cream Apply topically. (Patient not taking: Reported on 06/13/2024)     clotrimazole -betamethasone  (LOTRISONE ) cream Apply to affected area 2 times daily x 7 days. Allow to dry completely after application. (Patient not taking: Reported on 06/13/2024) 15 g 0   nystatin  (MYCOSTATIN /NYSTOP )  powder Apply 1 Application topically 3 (three) times daily. (Patient not taking: Reported on 06/13/2024) 15 g 0   oseltamivir  (  TAMIFLU ) 75 MG capsule Take 1 capsule (75 mg total) by mouth every 12 (twelve) hours. (Patient not taking: Reported on 06/13/2024) 10 capsule 0   No current facility-administered medications on file prior to visit.    There are no Patient Instructions on file for this visit. No follow-ups on file.   Gwendlyn JONELLE Shank, NP

## 2024-10-14 ENCOUNTER — Encounter: Payer: Self-pay | Admitting: Emergency Medicine

## 2024-10-14 ENCOUNTER — Encounter

## 2024-10-14 ENCOUNTER — Ambulatory Visit
Admission: EM | Admit: 2024-10-14 | Discharge: 2024-10-14 | Disposition: A | Discharge status: Home / Self Care | Attending: Emergency Medicine | Admitting: Emergency Medicine

## 2024-10-14 DIAGNOSIS — J0110 Acute frontal sinusitis, unspecified: Secondary | ICD-10-CM | POA: Diagnosis not present

## 2024-10-14 MED ORDER — PREDNISONE 20 MG PO TABS: 40.0000 mg | ORAL_TABLET | Freq: Every day | ORAL | 0 refills | Status: DC

## 2024-10-14 MED ORDER — AZITHROMYCIN 250 MG PO TABS: 250.0000 mg | ORAL_TABLET | Freq: Every day | ORAL | 0 refills | Status: DC

## 2024-10-14 NOTE — ED Provider Notes (Signed)
 Ricky Barrett    CSN: [id number] Arrival date & time: 10/14/24  1618      History   Chief Complaint Chief Complaint  Patient presents with   Nasal Congestion   Facial Pain    HPI Ricky Barrett is a 58 y.o. male.   Patient presents for evaluation of nasal congestion and sinus pressure to the forehead present for 5 days.  Symptoms progressively worsening, interfering with sleep, pain worse to the left frontal described as a throbbing and being hit with a hammer.  Has noticed blood within the mucus.  Has attempted use of DayQuil without improvement.  Tolerable to food and liquids.  Denies fever, cough, ear pain or sore throat.  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 No pertinent surgical history.     Home Medications    Prior to Admission medications   Medication Sig Start Date End Date Taking? Authorizing Provider  aspirin EC 81 MG tablet Take by mouth. 09/05/23   [provider]  celecoxib (CELEBREX) 200 MG capsule Take 200 mg by mouth daily. Patient not taking: Reported on 06/13/2024 12/13/23   [provider]  CIALIS 20 MG tablet Take 20 mg by mouth daily as needed. 08/28/22 01/28/25  [provider]  clotrimazole  (LOTRIMIN ) 1 % cream Apply topically. Patient not taking: Reported on 06/13/2024 11/13/23   [provider]  clotrimazole -betamethasone  (LOTRISONE ) cream Apply to affected area 2 times daily x 7 days. Allow to dry completely after application. Patient not taking: Reported on 06/13/2024 02/28/23   Immordino, Garnette,  FNP  Dietary Management Product (VASCULERA PO) Take 650 mg by mouth daily.    [provider]  ibuprofen  (ADVIL ) 600 MG tablet Take 1 tablet (600 mg total) by mouth every 6 (six) hours as needed. 06/06/21   Mortenson, Ashley, MD  lisinopril (PRINIVIL,ZESTRIL) 10 MG tablet Take 10 mg by mouth daily.    [provider]  nystatin  (MYCOSTATIN /NYSTOP ) powder Apply 1 Application topically 3 (three) times daily. Patient not taking: Reported on 06/13/2024 02/28/23   Immordino, Garnette, FNP  omeprazole (PRILOSEC) 20 MG capsule Take 20 mg by mouth daily.    [provider]  oseltamivir  (TAMIFLU ) 75 MG capsule Take 1 capsule (75 mg total) by mouth every 12 (twelve) hours. Patient not taking: Reported on 06/13/2024 12/12/22   Maranda Jamee Jacob, MD  Semaglutide -Weight Management (WEGOVY ) 0.25 MG/0.5ML SOAJ Inject 0.25 mg into the skin once a week. FOR 4 WEEKS 05/22/24   Darliss Rogue, MD  simvastatin (ZOCOR) 10 MG tablet Take 10 mg by mouth daily at 6 PM. 08/28/22   [provider]    Family History Family History  Problem Relation Age of Onset   Diabetes Mother    Hypertension Mother    Heart attack Father    Heart attack Paternal Grandfather     Social History Social History   Tobacco Use   Smoking status: Never    Passive exposure: Past   Smokeless tobacco: Never  Substance Use Topics   Alcohol use: No   Drug use:  No     Allergies   Sildenafil and Sildenafil citrate   Review of Systems Review of Systems  Constitutional: Negative.   HENT:  Positive for congestion, sinus pressure and sinus pain. Negative for dental problem, drooling, ear discharge, ear pain, facial swelling, hearing loss, mouth sores, nosebleeds, postnasal drip, rhinorrhea, sneezing, sore throat, tinnitus, trouble swallowing and voice change.   Respiratory: Negative.    Gastrointestinal: Negative.      Physical Exam Triage Vital Signs ED Triage Vitals  Encounter Vitals Group      BP 10/14/24 1636 (!) 142/93     Girls Systolic BP Percentile --      Girls Diastolic BP Percentile --      Boys Systolic BP Percentile --      Boys Diastolic BP Percentile --      Pulse Rate 10/14/24 1636 78     Resp 10/14/24 1636 18     Temp 10/14/24 1636 99.3 F (37.4 C)     Temp Source 10/14/24 1636 Oral     SpO2 10/14/24 1636 95 %     Weight --      Height --      Head Circumference --      Peak Flow --      Pain Score 10/14/24 1633 6     Pain Loc --      Pain Education --      Exclude from Growth Chart --    No data found.  Updated Vital Signs BP (!) 142/93 (BP Location: Left Arm)   Pulse 78   Temp 99.3 F (37.4 C) (Oral)   Resp 18   SpO2 95%   Visual Acuity Right Eye Distance:   Left Eye Distance:   Bilateral Distance:    Right Eye Near:   Left Eye Near:    Bilateral Near:     Physical Exam Constitutional:      Appearance: Normal appearance.  HENT:     Head: Normocephalic.     Right Ear: Tympanic membrane, ear canal and external ear normal.     Left Ear: Tympanic membrane, ear canal and external ear normal.     Nose: Congestion present.     Left Sinus: Frontal sinus tenderness present.     Mouth/Throat:     Pharynx: Posterior oropharyngeal erythema present. No oropharyngeal exudate.  Eyes:     Extraocular Movements: Extraocular movements intact.  Pulmonary:     Effort: Pulmonary effort is normal.  Neurological:     Mental Status: He is alert and oriented to person, place, and time.      UC Treatments / Results  Labs (all labs ordered are listed, but only abnormal results are displayed) Labs Reviewed - No data to display  EKG   Radiology No results found.  Procedures Procedures (including critical care time)  Medications Ordered in UC Medications - No data to display  Initial Impression / Assessment and Plan / UC Course  I have reviewed the triage vital signs and the nursing notes.  Pertinent labs & imaging results that were  available during my care of the patient were reviewed by me and considered in my medical decision making (see chart for details).  Acute nonrecurrent frontal sinusitis  Patient is in no signs of distress nor toxic appearing.  Vital signs are stable.  Low suspicion for pneumonia, pneumothorax or bronchitis and therefore will defer imaging.  Viral testing deferred due to timeline.  Symptomology and presentation consistent with a sinusitis  empirically placed on azithromycin  and additionally prescribed prednisone .May use additional over-the-counter medications as needed for supportive care.  May follow-up with urgent care as needed if symptoms persist or worsen.   Final Clinical Impressions(s) / UC Diagnoses   Final diagnoses:  None   Discharge Instructions   None    ED Prescriptions   None    PDMP not reviewed this encounter.   Teresa Shelba SAUNDERS, NP 10/14/24 1650

## 2024-10-14 NOTE — ED Triage Notes (Signed)
 Patient reports nasal congestion and sinus pressure x 5 days. Patient reports taking Dyquil with no relief. Rates pain 6/10.

## 2024-10-14 NOTE — Discharge Instructions (Addendum)
 Today you are being treated for a sinus infection  Take azithromycin  as directed  Begin prednisone every morning with food for 5 days starting tomorrow, this helps to reduce inflammation will help calm sinus pressure    You can take Tylenol   as needed for fever reduction and pain relief.   For cough: honey 1/2 to 1 teaspoon (you can dilute the honey in water or another fluid).  You can also use guaifenesin and dextromethorphan for cough. You can use a humidifier for chest congestion and cough.  If you don't have a humidifier, you can sit in the bathroom with the hot shower running.      For sore throat: try warm salt water gargles, cepacol lozenges, throat spray, warm tea or water with lemon/honey, popsicles or ice, or OTC cold relief medicine for throat discomfort.   For congestion: take a daily anti-histamine like Zyrtec, Claritin, and a oral decongestant, such as pseudoephedrine.  You can also use Flonase  1-2 sprays in each nostril daily.   It is important to stay hydrated: drink plenty of fluids (water, gatorade/powerade/pedialyte, juices, or teas) to keep your throat moisturized and help further relieve irritation/discomfort.

## 2024-12-19 ENCOUNTER — Ambulatory Visit
Admission: RE | Admit: 2024-12-19 | Discharge: 2024-12-19 | Disposition: A | Discharge status: Home / Self Care | Source: Home / Self Care

## 2024-12-19 VITALS — BP 158/98 | HR 73 | Temp 98.6°F | Resp 20

## 2024-12-19 DIAGNOSIS — R519 Headache, unspecified: Secondary | ICD-10-CM

## 2024-12-19 DIAGNOSIS — R0981 Nasal congestion: Secondary | ICD-10-CM

## 2024-12-19 DIAGNOSIS — I10 Essential (primary) hypertension: Secondary | ICD-10-CM

## 2024-12-19 MED ORDER — IBUPROFEN 600 MG PO TABS: 600.0000 mg | ORAL_TABLET | Freq: Four times a day (QID) | ORAL | 0 refills | Status: AC | PRN

## 2024-12-19 MED ORDER — GUAIFENESIN ER 600 MG PO TB12: 1200.0000 mg | ORAL_TABLET | Freq: Two times a day (BID) | ORAL | 0 refills | Status: AC | PRN

## 2024-12-19 NOTE — ED Triage Notes (Signed)
 Sinus pain and pressure, congestion x 3 days. Using nasal spray and tylenol .

## 2024-12-19 NOTE — ED Provider Notes (Signed)
 " [name]    CSN: [id number] Arrival date & time: 12/19/24  9173      History   Chief Complaint Chief Complaint  Patient presents with   Facial Pain    HPI LISSANDRO DILORENZO is a 59 y.o. male.  Patient presents with 3-day history of sinus pressure, congestion, headache.  Treatment attempted with Tylenol .  No fever, cough, shortness of breath, vomiting, diarrhea.  Patient was seen at this urgent care on 10/14/2024; diagnosed with acute sinusitis; treated with Zithromax  and prednisone .  His medical history includes hypertension.  The history is provided by the patient and medical records.    Past Medical History:  Diagnosis Date   Hypertension     Patient Active Problem List   Diagnosis Date Noted   Lower limb ulcer, ankle, right, limited to breakdown of skin (HCC) 01/29/2024   GERD (gastroesophageal reflux disease) 08/08/2013   ERECTILE DYSFUNCTION 01/07/2009   Dyslipidemia 11/11/2008   Essential hypertension, benign 11/11/2008   KNEE PAIN, LEFT 11/11/2008    History reviewed. No pertinent surgical history.     Home Medications    Prior to Admission medications  Medication Sig Start Date End Date Taking? Authorizing Provider  aspirin EC 81 MG tablet Take by mouth. 09/05/23  Yes [provider]  guaiFENesin  (MUCINEX ) 600 MG 12 hr tablet Take 2 tablets (1,200 mg total) by mouth 2 (two) times daily as needed. 12/19/24  Yes Corlis Burnard DEL, NP  ibuprofen  (ADVIL ) 600 MG tablet Take 1 tablet (600 mg total) by mouth every 6 (six) hours as needed. 12/19/24  Yes Corlis Burnard DEL, NP  lisinopril (PRINIVIL,ZESTRIL) 10 MG tablet Take 10 mg by mouth daily.   Yes [provider]  simvastatin (ZOCOR) 10 MG tablet Take 10 mg by mouth daily at 6 PM. 08/28/22  Yes [provider]  celecoxib (CELEBREX) 200 MG capsule Take 200 mg by mouth daily. Patient not taking: Reported on 06/13/2024 12/13/23   [provider]  CIALIS 20 MG tablet Take 20 mg by mouth  daily as needed. 08/28/22 01/28/25  [provider]  clotrimazole  (LOTRIMIN ) 1 % cream Apply topically. Patient not taking: Reported on 06/13/2024 11/13/23   [provider]  clotrimazole -betamethasone  (LOTRISONE ) cream Apply to affected area 2 times daily x 7 days. Allow to dry completely after application. Patient not taking: Reported on 06/13/2024 02/28/23   Immordino, Garnette, FNP  Dietary Management Product (VASCULERA PO) Take 650 mg by mouth daily.    [provider]  nystatin  (MYCOSTATIN /NYSTOP ) powder Apply 1 Application topically 3 (three) times daily. Patient not taking: Reported on 06/13/2024 02/28/23   Immordino, Garnette, FNP  omeprazole (PRILOSEC) 20 MG capsule Take 20 mg by mouth daily.    [provider]  Semaglutide -Weight Management (WEGOVY ) 0.25 MG/0.5ML SOAJ Inject 0.25 mg into the skin once a week. FOR 4 WEEKS Patient not taking: Reported on 12/19/2024 05/22/24   Darliss Rogue, MD    Family History Family History  Problem Relation Age of Onset   Diabetes Mother    Hypertension Mother    Heart attack Father    Heart attack Paternal Grandfather     Social History Social History[1]   Allergies   Sildenafil and Sildenafil citrate   Review of Systems Review of Systems  Constitutional:  Negative for chills and fever.  HENT:  Positive for congestion and sinus pressure. Negative for ear pain and sore throat.   Respiratory:  Negative for cough and shortness of breath.  Cardiovascular:  Negative for chest pain and palpitations.  Gastrointestinal:  Negative for diarrhea and vomiting.  Neurological:  Positive for headaches.     Physical Exam Triage Vital Signs ED Triage Vitals  Encounter Vitals Group     BP 12/19/24 0848 (!) 156/100     Girls Systolic BP Percentile --      Girls Diastolic BP Percentile --      Boys Systolic BP Percentile --      Boys Diastolic BP Percentile --      Pulse Rate 12/19/24 0847 73     Resp 12/19/24  0847 20     Temp 12/19/24 0847 98.6 F (37 C)     Temp Source 12/19/24 0847 Oral     SpO2 12/19/24 0847 95 %     Weight --      Height --      Head Circumference --      Peak Flow --      Pain Score 12/19/24 0848 8     Pain Loc --      Pain Education --      Exclude from Growth Chart --    No data found.  Updated Vital Signs BP (!) 158/98 (BP Location: Left Arm)   Pulse 73   Temp 98.6 F (37 C) (Oral)   Resp 20   SpO2 95%   Visual Acuity Right Eye Distance:   Left Eye Distance:   Bilateral Distance:    Right Eye Near:   Left Eye Near:    Bilateral Near:     Physical Exam Constitutional:      General: He is not in acute distress. HENT:     Right Ear: Tympanic membrane normal.     Left Ear: Tympanic membrane normal.     Nose: Congestion present.     Mouth/Throat:     Mouth: Mucous membranes are moist.     Pharynx: Oropharynx is clear.  Cardiovascular:     Rate and Rhythm: Normal rate and regular rhythm.     Heart sounds: Normal heart sounds.  Pulmonary:     Effort: Pulmonary effort is normal. No respiratory distress.     Breath sounds: Normal breath sounds.  Neurological:     Mental Status: He is alert.      UC Treatments / Results  Labs (all labs ordered are listed, but only abnormal results are displayed) Labs Reviewed - No data to display  EKG   Radiology No results found.  Procedures Procedures (including critical care time)  Medications Ordered in UC Medications - No data to display  Initial Impression / Assessment and Plan / UC Course  I have reviewed the triage vital signs and the nursing notes.  Pertinent labs & imaging results that were available during my care of the patient were reviewed by me and considered in my medical decision making (see chart for details).    Nasal congestion, sinus headache, elevated blood pressure reading with hypertension.  Patient has been symptomatic for 3 days.  Discussed antibiotic stewardship.   Treating today with ibuprofen  and Mucinex .  Education provided on sinus pain.  Also discussed with patient that his blood pressure is elevated today and needs to be rechecked by his PCP next week.  Education provided on managing hypertension.  ED precautions given.  Patient agrees to plan of care.  Final Clinical Impressions(s) / UC Diagnoses   Final diagnoses:  Nasal congestion  Sinus headache  Elevated blood pressure reading in office  with diagnosis of hypertension     Discharge Instructions      Take the ibuprofen  and Mucinex  as directed.  Follow-up with your primary care provider if your symptoms are not improving.    Your blood pressure is elevated today at 156/100; repeat 167/110.  Please have this rechecked by your primary care provider next week.          ED Prescriptions     Medication Sig Dispense Auth. Provider   guaiFENesin  (MUCINEX ) 600 MG 12 hr tablet Take 2 tablets (1,200 mg total) by mouth 2 (two) times daily as needed. 12 tablet Corlis Burnard DEL, NP   ibuprofen  (ADVIL ) 600 MG tablet Take 1 tablet (600 mg total) by mouth every 6 (six) hours as needed. 20 tablet Corlis Burnard DEL, NP      PDMP not reviewed this encounter.    [1]  Social History Tobacco Use   Smoking status: Never    Passive exposure: Past   Smokeless tobacco: Never  Substance Use Topics   Alcohol use: No   Drug use: No     Corlis Burnard DEL, NP 12/19/24 [phone]  "

## 2024-12-19 NOTE — Discharge Instructions (Addendum)
 Take the ibuprofen  and Mucinex  as directed.  Follow-up with your primary care provider if your symptoms are not improving.    Your blood pressure is elevated today at 156/100; repeat 167/110.  Please have this rechecked by your primary care provider next week.
